# Patient Record
Sex: Male | Born: 1970 | Race: White | Hispanic: No | Marital: Married | Smoking: Never smoker
Health system: Southern US, Community
[De-identification: ages and names within clinical notes are randomized; demographics above are authoritative.]

## PROBLEM LIST (undated history)

## (undated) DIAGNOSIS — I10 Essential (primary) hypertension: Secondary | ICD-10-CM

## (undated) DIAGNOSIS — N200 Calculus of kidney: Secondary | ICD-10-CM

## (undated) DIAGNOSIS — Z87442 Personal history of urinary calculi: Secondary | ICD-10-CM

## (undated) DIAGNOSIS — F419 Anxiety disorder, unspecified: Secondary | ICD-10-CM

## (undated) HISTORY — PX: EYE SURGERY: SHX253

---
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
## 2005-02-16 ENCOUNTER — Emergency Department: Payer: Self-pay | Admitting: Emergency Medicine

## 2005-06-28 ENCOUNTER — Emergency Department: Payer: Self-pay | Admitting: Emergency Medicine

## 2006-08-13 ENCOUNTER — Emergency Department: Payer: Self-pay | Admitting: Emergency Medicine

## 2007-10-08 ENCOUNTER — Other Ambulatory Visit: Payer: Self-pay

## 2007-10-08 ENCOUNTER — Emergency Department: Payer: Self-pay | Admitting: Emergency Medicine

## 2008-07-30 ENCOUNTER — Emergency Department: Payer: Self-pay | Admitting: Emergency Medicine

## 2009-08-10 ENCOUNTER — Emergency Department: Payer: Self-pay | Admitting: Unknown Physician Specialty

## 2009-09-19 ENCOUNTER — Emergency Department: Payer: Self-pay | Admitting: Internal Medicine

## 2010-03-08 ENCOUNTER — Ambulatory Visit: Payer: Self-pay | Admitting: Internal Medicine

## 2010-06-11 ENCOUNTER — Emergency Department: Payer: Self-pay | Admitting: Emergency Medicine

## 2010-06-13 ENCOUNTER — Emergency Department: Payer: Self-pay | Admitting: Emergency Medicine

## 2010-06-19 ENCOUNTER — Ambulatory Visit: Payer: Self-pay | Admitting: Urology

## 2010-06-27 ENCOUNTER — Emergency Department: Payer: Self-pay | Admitting: Emergency Medicine

## 2010-07-03 ENCOUNTER — Ambulatory Visit: Payer: Self-pay | Admitting: Urology

## 2010-07-17 ENCOUNTER — Ambulatory Visit: Payer: Self-pay | Admitting: Urology

## 2010-07-18 ENCOUNTER — Ambulatory Visit: Payer: Self-pay | Admitting: Urology

## 2010-07-24 ENCOUNTER — Ambulatory Visit: Payer: Self-pay | Admitting: Urology

## 2010-07-25 ENCOUNTER — Emergency Department: Payer: Self-pay | Admitting: Emergency Medicine

## 2011-03-17 ENCOUNTER — Emergency Department: Payer: Self-pay | Admitting: Emergency Medicine

## 2012-02-13 IMAGING — CR DG ABDOMEN 1V
1 series · 1 of 1 positions shown · non-contrast
Comparison: none

REASON FOR EXAM: kidney stone
COMMENTS:

[view not recorded]
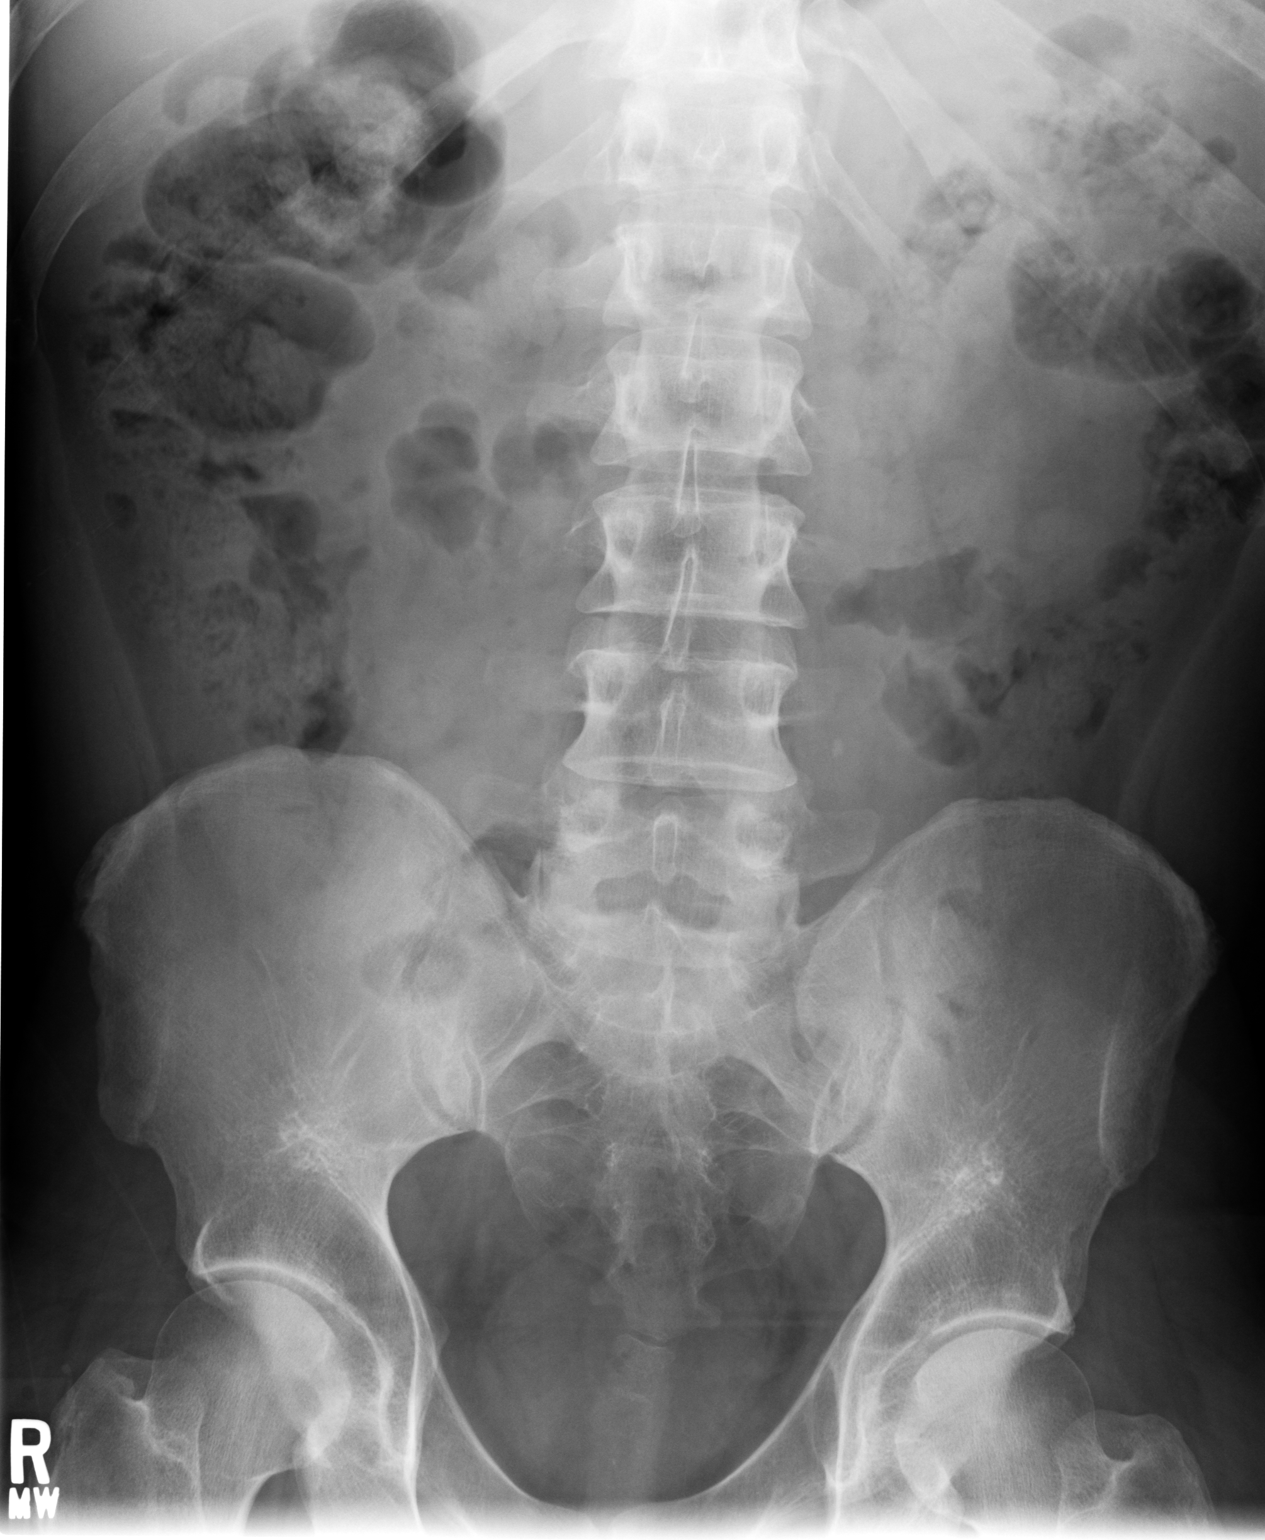

[1 of 1 positions shown; findings below may reference images not displayed]

PROCEDURE:     DXR - DXR KIDNEY URETER BLADDER  - June 13, 2010  [DATE]

RESULT:     AP view of the abdomen shows a 5 mm calcification inferior to
the L4 lumbar transverse process on the left and consistent with a left
ureteral stone. No other renal or ureteral calcifications are identified.
There is a moderate amount of fecal material in the colon. No dilated bowel
loops suspicious for bowel obstruction are seen. The osseous structures are
normal in appearance.
IMPRESSION: Probable left ureterolithiasis.

## 2012-04-16 ENCOUNTER — Emergency Department: Payer: Self-pay | Admitting: Emergency Medicine

## 2012-10-19 ENCOUNTER — Ambulatory Visit: Payer: Self-pay | Admitting: Family Medicine

## 2013-12-17 ENCOUNTER — Ambulatory Visit: Payer: Self-pay | Admitting: Emergency Medicine

## 2013-12-18 ENCOUNTER — Emergency Department: Payer: Self-pay | Admitting: Emergency Medicine

## 2013-12-18 LAB — BASIC METABOLIC PANEL
BUN: 12 mg/dL (ref 7–18)
Chloride: 103 mmol/L (ref 98–107)
Co2: 31 mmol/L (ref 21–32)
EGFR (African American): >60
EGFR (Non-African Amer.): >60
Glucose: 89 mg/dL (ref 65–99)
Osmolality: 277 (ref 275–301)
Potassium: 3.6 mmol/L (ref 3.5–5.1)
Sodium: 139 mmol/L (ref 136–145)

## 2013-12-18 LAB — CBC
HCT: 41.3 % (ref 40.0–52.0)
HGB: 14.3 g/dL (ref 13.0–18.0)
MCV: 86 fL (ref 80–100)
Platelet: 99 10*3/uL — ABNORMAL LOW (ref 150–440)
RBC: 4.81 10*6/uL (ref 4.40–5.90)
RDW: 13.8 % (ref 11.5–14.5)
WBC: 3.9 10*3/uL (ref 3.8–10.6)

## 2014-07-11 ENCOUNTER — Emergency Department: Payer: Self-pay | Admitting: Emergency Medicine

## 2014-07-11 LAB — CBC WITH DIFFERENTIAL/PLATELET
BASOS PCT: 0.7 %
Basophil #: 0 10*3/uL (ref 0.0–0.1)
EOS ABS: 0.1 10*3/uL (ref 0.0–0.7)
Eosinophil %: 2.1 %
HCT: 45.9 % (ref 40.0–52.0)
HGB: 15.7 g/dL (ref 13.0–18.0)
Lymphocyte #: 2.5 10*3/uL (ref 1.0–3.6)
Lymphocyte %: 39.7 %
MCH: 30 pg (ref 26.0–34.0)
MCHC: 34.3 g/dL (ref 32.0–36.0)
MCV: 88 fL (ref 80–100)
MONOS PCT: 12.8 %
Monocyte #: 0.8 x10 3/mm (ref 0.2–1.0)
NEUTROS ABS: 2.8 10*3/uL (ref 1.4–6.5)
Neutrophil %: 44.7 %
Platelet: 152 10*3/uL (ref 150–440)
RBC: 5.25 10*6/uL (ref 4.40–5.90)
RDW: 13.8 % (ref 11.5–14.5)
WBC: 6.3 10*3/uL (ref 3.8–10.6)

## 2014-07-11 LAB — COMPREHENSIVE METABOLIC PANEL
ALK PHOS: 94 U/L
ALT: 37 U/L
ANION GAP: 5 — AB (ref 7–16)
Albumin: 4 g/dL (ref 3.4–5.0)
BUN: 15 mg/dL (ref 7–18)
Bilirubin,Total: 0.4 mg/dL (ref 0.2–1.0)
CREATININE: 1.33 mg/dL — AB (ref 0.60–1.30)
Calcium, Total: 9.7 mg/dL (ref 8.5–10.1)
Chloride: 104 mmol/L (ref 98–107)
Co2: 29 mmol/L (ref 21–32)
EGFR (African American): >60
EGFR (Non-African Amer.): >60
Glucose: 93 mg/dL (ref 65–99)
Osmolality: 276 (ref 275–301)
Potassium: 3.9 mmol/L (ref 3.5–5.1)
SGOT(AST): 25 U/L (ref 15–37)
SODIUM: 138 mmol/L (ref 136–145)
Total Protein: 8.6 g/dL — ABNORMAL HIGH (ref 6.4–8.2)

## 2014-07-11 LAB — TROPONIN I

## 2014-08-14 ENCOUNTER — Emergency Department: Payer: Self-pay | Admitting: Emergency Medicine

## 2014-08-14 LAB — COMPREHENSIVE METABOLIC PANEL
ALBUMIN: 3.6 g/dL (ref 3.4–5.0)
ALT: 36 U/L
AST: 39 U/L — AB (ref 15–37)
Alkaline Phosphatase: 106 U/L
Anion Gap: 8 (ref 7–16)
BUN: 20 mg/dL — AB (ref 7–18)
Bilirubin,Total: 0.3 mg/dL (ref 0.2–1.0)
CREATININE: 1.11 mg/dL (ref 0.60–1.30)
Calcium, Total: 9.1 mg/dL (ref 8.5–10.1)
Chloride: 105 mmol/L (ref 98–107)
Co2: 28 mmol/L (ref 21–32)
EGFR (Non-African Amer.): >60
GLUCOSE: 104 mg/dL — AB (ref 65–99)
Osmolality: 284 (ref 275–301)
POTASSIUM: 3.8 mmol/L (ref 3.5–5.1)
SODIUM: 141 mmol/L (ref 136–145)
TOTAL PROTEIN: 7.5 g/dL (ref 6.4–8.2)

## 2014-08-14 LAB — CBC WITH DIFFERENTIAL/PLATELET
BASOS PCT: 0.5 %
Basophil #: 0 10*3/uL (ref 0.0–0.1)
EOS ABS: 0.2 10*3/uL (ref 0.0–0.7)
Eosinophil %: 2.9 %
HCT: 42.7 % (ref 40.0–52.0)
HGB: 14.8 g/dL (ref 13.0–18.0)
Lymphocyte #: 3 10*3/uL (ref 1.0–3.6)
Lymphocyte %: 42.8 %
MCH: 30.4 pg (ref 26.0–34.0)
MCHC: 34.6 g/dL (ref 32.0–36.0)
MCV: 88 fL (ref 80–100)
Monocyte #: 0.8 x10 3/mm (ref 0.2–1.0)
Monocyte %: 11.7 %
NEUTROS ABS: 3 10*3/uL (ref 1.4–6.5)
Neutrophil %: 42.1 %
Platelet: 133 10*3/uL — ABNORMAL LOW (ref 150–440)
RBC: 4.87 10*6/uL (ref 4.40–5.90)
RDW: 14.1 % (ref 11.5–14.5)
WBC: 7.1 10*3/uL (ref 3.8–10.6)

## 2015-08-13 ENCOUNTER — Ambulatory Visit
Admission: EM | Admit: 2015-08-13 | Discharge: 2015-08-13 | Disposition: A | Discharge status: Home / Self Care | Payer: BLUE CROSS/BLUE SHIELD | Attending: Family Medicine | Admitting: Family Medicine

## 2015-08-13 ENCOUNTER — Ambulatory Visit: Payer: BLUE CROSS/BLUE SHIELD

## 2015-08-13 DIAGNOSIS — S7002XA Contusion of left hip, initial encounter: Secondary | ICD-10-CM | POA: Diagnosis not present

## 2015-08-13 HISTORY — DX: Essential (primary) hypertension: I10

## 2015-08-13 HISTORY — DX: Calculus of kidney: N20.0

## 2015-08-13 LAB — URINALYSIS COMPLETE WITH MICROSCOPIC (ARMC ONLY)
BACTERIA UA: NONE SEEN — AB
Bilirubin Urine: NEGATIVE
Glucose, UA: NEGATIVE mg/dL
HGB URINE DIPSTICK: NEGATIVE
Ketones, ur: NEGATIVE mg/dL
LEUKOCYTES UA: NEGATIVE
NITRITE: NEGATIVE
PH: 5.5 (ref 5.0–8.0)
PROTEIN: NEGATIVE mg/dL
RBC / HPF: NONE SEEN RBC/hpf (ref <3)
Specific Gravity, Urine: 1.02 (ref 1.005–1.030)
Squamous Epithelial / LPF: NONE SEEN — AB

## 2015-08-13 MED ORDER — MELOXICAM 15 MG PO TABS: 15.0000 mg | ORAL_TABLET | Freq: Every day | ORAL | Status: DC

## 2015-08-13 NOTE — Discharge Instructions (Signed)
Contusion A contusion is a deep bruise. Contusions happen when an injury causes bleeding under the skin. Signs of bruising include pain, puffiness (swelling), and discolored skin. The contusion may turn blue, purple, or yellow. HOME CARE   Put ice on the injured area.  Put ice in a plastic bag.  Place a towel between your skin and the bag.  Leave the ice on for 15-20 minutes, 03-04 times a day.  Only take medicine as told by your doctor.  Rest the injured area.  If possible, raise (elevate) the injured area to lessen puffiness. GET HELP RIGHT AWAY IF:   You have more bruising or puffiness.  You have pain that is getting worse.  Your puffiness or pain is not helped by medicine. MAKE SURE YOU:   Understand these instructions.  Will watch your condition.  Will get help right away if you are not doing well or get worse. Document Released: 05/25/2008 Document Revised: 02/29/2012 Document Reviewed: 10/12/2011 [facility] Patient Information 2015 [place], [place]. This information is not intended to replace advice given to you by your health care provider. Make sure you discuss any questions you have with your health care provider.  Iliac Crest Contusion  An iliac crest contusion is a deep bruise of your hip bone (hip pointer). Contusions happen when an injury causes bleeding under the skin. Signs of bruising include pain, puffiness (swelling), and discolored skin. The contusion may turn blue, purple, or yellow. HOME CARE   Put ice on the injured area.  Put ice in a plastic bag.  Place a towel between your skin and the bag.  Leave the ice on for 15-20 minutes, 03-04 times a day.  Only take medicines as told by your doctor.  Keep your leg straight (extended) when possible.  Walk and move around as pain allows, or as told by your doctor. Use crutches if you are told to do so.  Put on an elastic wrap as told by your doctor. You can take it off for sleeping, showers, and  baths. GET HELP RIGHT AWAY IF:  You have more bruising or puffiness.  You have pain that is getting worse.  Your puffiness or pain is not helped by medicines.  Your toes get cold. MAKE SURE YOU:   Understand these instructions.  Will watch your condition.  Will get help right away if you are not doing well or get worse. Document Released: 11/26/2011 Document Revised: 06/07/2012 Document Reviewed: 11/26/2011 [facility] Patient Information 2015 [place], [place]. This information is not intended to replace advice given to you by your health care provider. Make sure you discuss any questions you have with your health care provider.

## 2015-08-13 NOTE — ED Provider Notes (Addendum)
CSN: [id number]     Arrival date & time 08/13/15  1415 History   First MD Initiated Contact with Patient 08/13/15 1450     Chief Complaint  Patient presents with  . Fall   (Consider location/radiation/quality/duration/timing/severity/associated sxs/prior Treatment) Patient is a 44 y.o. male presenting with fall and hip pain. The history is provided by the patient and the spouse. No language interpreter was used.  Fall This is a new problem. The current episode started yesterday. The problem occurs constantly. The problem has not changed since onset.Pertinent negatives include no chest pain, no abdominal pain, no headaches and no shortness of breath. The symptoms are aggravated by walking. Nothing relieves the symptoms. He has tried nothing for the symptoms.  Hip Pain This is a new problem. The current episode started yesterday. The problem occurs constantly. The problem has not changed since onset.Pertinent negatives include no chest pain, no abdominal pain, no headaches and no shortness of breath. The symptoms are aggravated by walking. Nothing relieves the symptoms. He has tried nothing for the symptoms.    Past Medical History  Diagnosis Date  . Hypertension   . Kidney stones    History reviewed. No pertinent past surgical history. Family History  Problem Relation Age of Onset  . Hypertension Mother   . Diabetes Father   . Hypertension Father   . Seizures Brother    Social History  Substance Use Topics  . Smoking status: Never Smoker   . Smokeless tobacco: Current User    Types: Chew  . Alcohol Use: No    patient does not smoke but does use chews tobacco which according to his wife is trying to quit. History of hypertension as well    Review of Systems  Constitutional: Positive for activity change. Negative for fever and appetite change.  Respiratory: Negative for shortness of breath.   Cardiovascular: Negative for chest pain.  Gastrointestinal: Negative for abdominal  pain.  Musculoskeletal: Positive for myalgias, joint swelling and gait problem.  Neurological: Negative for dizziness, light-headedness and headaches.    Allergies  Review of patient's allergies indicates no known allergies.  Home Medications   Prior to Admission medications   Medication Sig Start Date End Date Taking? Authorizing Provider  hydrochlorothiazide (HYDRODIURIL) 12.5 MG tablet Take 12.5 mg by mouth daily.   Yes Historical Provider, MD  meloxicam (MOBIC) 15 MG tablet Take 1 tablet (15 mg total) by mouth daily. 08/13/15   Hassan Rowan, MD   BP 141/75 mmHg  Pulse 56  Temp(Src) 97.8 F (36.6 C) (Tympanic)  Resp 16  Ht 5\' 8"  (1.727 m)  Wt 185 lb (83.915 kg)  BMI 28.14 kg/m2  SpO2 100% Physical Exam  Constitutional: He is oriented to person, place, and time. He appears well-developed and well-nourished.  HENT:  Head: Normocephalic and atraumatic.  Eyes: Pupils are equal, round, and reactive to light.  Neck: Neck supple.  Cardiovascular: Regular rhythm.   Musculoskeletal: Normal range of motion. He exhibits tenderness.       Left hip: He exhibits tenderness.       Legs: Large hematomas over the left hip. Tender to palpation.  Neurological: He is alert and oriented to person, place, and time. No cranial nerve deficit.  Skin: Skin is warm.  Psychiatric: He has a normal mood and affect. His behavior is normal.  Vitals reviewed.   ED Course  Procedures (including critical care time) Labs Review Labs Reviewed  URINALYSIS COMPLETEWITH MICROSCOPIC [facility] ONLY) - Abnormal; Notable for the  following:    Bacteria, UA NONE SEEN (*)    Squamous Epithelial / LPF NONE SEEN (*)    All other components within normal limits    Imaging Review Dg Hip Unilat With Pelvis 2-3 Views Left  08/13/2015   CLINICAL DATA:  Patient complaining of left hip pain from jabbing left hip into gear shift on lawn mower yesterday. There is a visible bruise where is pain is. No previous injury  EXAM:  DG HIP (WITH OR WITHOUT PELVIS) 2-3V LEFT  COMPARISON:  None.  FINDINGS: There is no evidence of hip fracture or dislocation. There is no evidence of arthropathy or other focal bone abnormality.  IMPRESSION: Negative.   Electronically Signed   By: Amie Portland M.D.   On: 08/13/2015 15:41     MDM   1. Contusion of left hip     Urinalysis was negative for blood. Hip x-rays were also negative. Work note will be given for today and tomorrow. Patient's wife been instructed to use ice on the left hip. We'll place on Mobic 15 mg one not to use Motrin.   Hassan Rowan, MD 08/13/15 1601  Hassan Rowan, MD 08/13/15 2156

## 2015-08-13 NOTE — ED Notes (Signed)
States fell yesterday landing on the gear shift knob of riding lawn mower. Large painful bruise left lateral/flank area.

## 2017-02-20 ENCOUNTER — Emergency Department
Admission: EM | Admit: 2017-02-20 | Discharge: 2017-02-20 | Disposition: A | Discharge status: Home / Self Care | Payer: BLUE CROSS/BLUE SHIELD | Attending: Emergency Medicine | Admitting: Emergency Medicine

## 2017-02-20 ENCOUNTER — Encounter: Payer: Self-pay | Admitting: Emergency Medicine

## 2017-02-20 ENCOUNTER — Emergency Department: Payer: BLUE CROSS/BLUE SHIELD

## 2017-02-20 DIAGNOSIS — R202 Paresthesia of skin: Secondary | ICD-10-CM | POA: Insufficient documentation

## 2017-02-20 DIAGNOSIS — Z79899 Other long term (current) drug therapy: Secondary | ICD-10-CM | POA: Diagnosis not present

## 2017-02-20 DIAGNOSIS — F419 Anxiety disorder, unspecified: Secondary | ICD-10-CM | POA: Insufficient documentation

## 2017-02-20 DIAGNOSIS — I10 Essential (primary) hypertension: Secondary | ICD-10-CM | POA: Insufficient documentation

## 2017-02-20 DIAGNOSIS — F17220 Nicotine dependence, chewing tobacco, uncomplicated: Secondary | ICD-10-CM | POA: Insufficient documentation

## 2017-02-20 LAB — CBC WITH DIFFERENTIAL/PLATELET
BASOS ABS: 0 10*3/uL (ref 0–0.1)
BASOS PCT: 1 %
EOS PCT: 2 %
Eosinophils Absolute: 0.1 10*3/uL (ref 0–0.7)
HCT: 44.7 % (ref 40.0–52.0)
Hemoglobin: 15.5 g/dL (ref 13.0–18.0)
LYMPHS PCT: 37 %
Lymphs Abs: 1.6 10*3/uL (ref 1.0–3.6)
MCH: 30.2 pg (ref 26.0–34.0)
MCHC: 34.8 g/dL (ref 32.0–36.0)
MCV: 86.8 fL (ref 80.0–100.0)
Monocytes Absolute: 0.6 10*3/uL (ref 0.2–1.0)
Monocytes Relative: 13 %
Neutro Abs: 2.1 10*3/uL (ref 1.4–6.5)
Neutrophils Relative %: 47 %
PLATELETS: 145 10*3/uL — AB (ref 150–440)
RBC: 5.15 MIL/uL (ref 4.40–5.90)
RDW: 14.1 % (ref 11.5–14.5)
WBC: 4.5 10*3/uL (ref 3.8–10.6)

## 2017-02-20 LAB — BASIC METABOLIC PANEL
ANION GAP: 7 (ref 5–15)
BUN: 16 mg/dL (ref 6–20)
CALCIUM: 9.7 mg/dL (ref 8.9–10.3)
CO2: 25 mmol/L (ref 22–32)
Chloride: 105 mmol/L (ref 101–111)
Creatinine, Ser: 1.14 mg/dL (ref 0.61–1.24)
GFR calc Af Amer: >60 mL/min (ref >60)
Glucose, Bld: 102 mg/dL — ABNORMAL HIGH (ref 65–99)
POTASSIUM: 5.2 mmol/L — AB (ref 3.5–5.1)
SODIUM: 137 mmol/L (ref 135–145)

## 2017-02-20 LAB — TROPONIN I

## 2017-02-20 MED ORDER — LABETALOL HCL 5 MG/ML IV SOLN: 20.0000 mg | Freq: Once | INTRAVENOUS | Status: DC

## 2017-02-20 MED ORDER — ASPIRIN 81 MG PO CHEW: 324.0000 mg | CHEWABLE_TABLET | Freq: Once | ORAL | Status: DC

## 2017-02-20 NOTE — ED Notes (Signed)
NAD noted at time of D/C. Pt denies questions or concerns. Pt ambulatory to the lobby at this time.  

## 2017-02-20 NOTE — ED Triage Notes (Signed)
Pt ambulatory to tx rm from triage, report elevated BP, SBP in 170s, report mild headache and tingling in cheeks.  Pt NAD at this time.

## 2017-02-20 NOTE — ED Notes (Signed)
This RN to bedside, apologized for and explained delay to patient and wife, pt and wife state understanding. Denies any needs at this time. Will continue to monitor for further patient needs.

## 2017-02-20 NOTE — ED Notes (Signed)
This RN to bedside. Pt visualized in NAD. Respirations even and unlabored. Pt watching TV, delay explained to patient. Will continue to monitor for further patient needs.

## 2017-02-20 NOTE — ED Provider Notes (Addendum)
[facility] Emergency Department Provider Note  ____________________________________________   I have reviewed the triage vital signs and the nursing notes.   HISTORY  Chief Complaint Hypertension    HPI Dennis Francis is a 46 y.o. male the history of hypertension presents today complaining of bilateral cheek tingling, and a sensation that he feels somewhat weird. He has a history of poorly controlled hypertension. He has been on HCTZ for many years. He did take it last night. Symptoms began when he awoke this morning. He felt fine when he went but yesterday. He denies any nausea vomiting or headache. He denies a focal numbness or weakness aside from feeling both of his cheeks feeling "weird", he denies any difficulty speaking or talking. He states that he has been under great deal stress recently. He denies any SI or HI.     Past Medical History:  Diagnosis Date  . Hypertension   . Kidney stones     There are no active problems to display for this patient.   History reviewed. No pertinent surgical history.  Prior to Admission medications   Medication Sig Start Date End Date Taking? Authorizing Provider  hydrochlorothiazide (HYDRODIURIL) 12.5 MG tablet Take 12.5 mg by mouth daily.   Yes Historical Provider, MD  meloxicam (MOBIC) 15 MG tablet Take 1 tablet (15 mg total) by mouth daily. 08/13/15   Hassan RowanEugene Wade, MD    Allergies Patient has no known allergies.  Family History  Problem Relation Age of Onset  . Hypertension Mother   . Diabetes Father   . Hypertension Father   . Seizures Brother     Social History Social History  Substance Use Topics  . Smoking status: Never Smoker  . Smokeless tobacco: Current User    Types: Chew  . Alcohol use No    Review of Systems Constitutional: No fever/chills Eyes: No visual changes. ENT: No sore throat. No stiff neck no neck pain Cardiovascular: Denies chest pain. Respiratory: Denies shortness of  breath. Gastrointestinal:   no vomiting.  No diarrhea.  No constipation. Genitourinary: Negative for dysuria. Musculoskeletal: Negative lower extremity swelling Skin: Negative for rash. Neurological: See history of present illness 10-point ROS otherwise negative.  ____________________________________________   PHYSICAL EXAM:  VITAL SIGNS: ED Triage Vitals  Enc Vitals Group     BP 02/20/17 0653 (!) 182/101     Pulse Rate 02/20/17 0653 64     Resp 02/20/17 0653 20     Temp 02/20/17 0653 98 F (36.7 C)     Temp Source 02/20/17 0653 Oral     SpO2 02/20/17 0653 100 %     Weight 02/20/17 0654 190 lb (86.2 kg)     Height 02/20/17 0654 5\' 8"  (1.727 m)     Head Circumference --      Peak Flow --      Pain Score 02/20/17 0654 2     Pain Loc --      Pain Edu? --      Excl. in GC? --     Constitutional: Alert and oriented. Well appearing and in no acute distress. Eyes: Conjunctivae are normal. PERRL. EOMI. Head: Atraumatic. Nose: No congestion/rhinnorhea. Mouth/Throat: Mucous membranes are moist.  Oropharynx non-erythematous. Neck: No stridor.   Nontender with no meningismus Cardiovascular: Normal rate, regular rhythm. Grossly normal heart sounds.  Good peripheral circulation. Respiratory: Normal respiratory effort.  No retractions. Lungs CTAB. Abdominal: Soft and nontender. No distention. No guarding no rebound Back:  There is no focal  tenderness or step off.  there is no midline tenderness there are no lesions noted. there is no CVA tenderness Musculoskeletal: No lower extremity tenderness, no upper extremity tenderness. No joint effusions, no DVT signs strong distal pulses no edema Neurologic:  Cranial nerves II through XII are grossly intact 5 out of 5 strength bilateral upper and lower extremity. Finger to nose within normal limits heel to shin within normal limits, speech is normal with no word finding difficulty or dysarthria, reflexes symmetric, pupils are equally round and  reactive to light, there is no pronator drift, sensation is normal, vision is intact to confrontation, gait is deferred, there is no nystagmus, normal neurologic exam Skin:  Skin is warm, dry and intact. No rash noted. Psychiatric: Mood and affect are anxious. Speech and behavior are normal.  ____________________________________________   LABS (all labs ordered are listed, but only abnormal results are displayed)  Labs Reviewed  BASIC METABOLIC PANEL  CBC WITH DIFFERENTIAL/PLATELET  TROPONIN I   ____________________________________________  EKG  I personally interpreted any EKGs ordered by me or triage Sinus bradycardia rate 54 bpm no acute ischemic changes, normal axis, no ST elevation or depression side from bradycardia, no significant pathology noted ____________________________________________  RADIOLOGY  I reviewed any imaging ordered by me or triage that were performed during my shift and, if possible, patient and/or family made aware of any abnormal findings. ____________________________________________   PROCEDURES  Procedure(s) performed: None  Procedures  Critical Care performed: None  ____________________________________________   INITIAL IMPRESSION / ASSESSMENT AND PLAN / ED COURSE  Pertinent labs & imaging results that were available during my care of the patient were reviewed by me and considered in my medical decision making (see chart for details).  Patient with a nonfocal neurologic exam, appears quite anxious. States both his cheeks word "feeling weird", there is no evidence of significant unilateral neurologic issue at this time however given the pressure and vague neurologic complaints they will obtain a CT scan to see if the patient has had prior CVAs or further abnormalities are noted. Patient's blood pressure is trending down on its own. We will check basic blood work to see if he has had renal issues from hypertension, we will check a troponin as a  precaution given his complaints of facial symptoms but I don't think this represent referred ACS discomfort. Overall, patient's very comfortable and looks quite well and his pressure is noted is now in the 160s   ----------------------------------------- 9:39 AM on 02/20/2017 -----------------------------------------  Patient remains in no acute distress neurologic exam completely normal vital signs are reassuring at this time. As he relaxes, his blood pressures come down a great deal as expected and anticipated. Workup is normal. Patient does state that he feels that he was just stressed out this morning. He has no SI or HI. He will follow closely with his primary care doctor and I have recommended counseling. Return precautions and follow-up given and understood. Nothing to suggest CVA at this time or other indices of acute hypertensive crisis     ____________________________________________   FINAL CLINICAL IMPRESSION(S) / ED DIAGNOSES  Final diagnoses:  None      This chart was dictated using voice recognition software.  Despite best efforts to proofread,  errors can occur which can change meaning.       Jeanmarie Plant, MD 02/20/17 1610    Jeanmarie Plant, MD 02/20/17 9604    Jeanmarie Plant, MD 02/20/17 5409    Jeanmarie Plant,  MD 02/20/17 [phone]

## 2017-02-20 NOTE — ED Notes (Signed)
Pt returned from CT °

## 2017-10-05 ENCOUNTER — Encounter: Payer: Self-pay | Admitting: *Deleted

## 2017-10-05 ENCOUNTER — Ambulatory Visit
Admission: EM | Admit: 2017-10-05 | Discharge: 2017-10-05 | Disposition: A | Discharge status: Home / Self Care | Payer: BLUE CROSS/BLUE SHIELD | Attending: Family Medicine | Admitting: Family Medicine

## 2017-10-05 DIAGNOSIS — M545 Low back pain: Secondary | ICD-10-CM | POA: Diagnosis not present

## 2017-10-05 DIAGNOSIS — S39012A Strain of muscle, fascia and tendon of lower back, initial encounter: Secondary | ICD-10-CM | POA: Diagnosis not present

## 2017-10-05 MED ORDER — METAXALONE 800 MG PO TABS: 800.0000 mg | ORAL_TABLET | Freq: Three times a day (TID) | ORAL | 0 refills | Status: DC

## 2017-10-05 MED ORDER — MELOXICAM 15 MG PO TABS: 15.0000 mg | ORAL_TABLET | Freq: Every day | ORAL | 0 refills | Status: DC

## 2017-10-05 NOTE — Discharge Instructions (Signed)
Use ice 20 minute of every 2 hours times daily.
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
## 2020-04-25 ENCOUNTER — Other Ambulatory Visit: Payer: Self-pay

## 2020-04-25 ENCOUNTER — Ambulatory Visit
Admission: EM | Admit: 2020-04-25 | Discharge: 2020-04-25 | Disposition: A | Discharge status: Home / Self Care | Payer: BC Managed Care – PPO | Attending: Family Medicine | Admitting: Family Medicine

## 2020-04-25 ENCOUNTER — Encounter: Payer: Self-pay | Admitting: Emergency Medicine

## 2020-04-25 DIAGNOSIS — T50Z95A Adverse effect of other vaccines and biological substances, initial encounter: Secondary | ICD-10-CM | POA: Diagnosis not present

## 2020-04-25 MED ORDER — PREDNISONE 10 MG (21) PO TBPK: ORAL_TABLET | ORAL | 0 refills | Status: DC

## 2020-04-25 NOTE — ED Provider Notes (Signed)
[facility]    CSN: [id number] Arrival date & time: 04/25/20  0949      History   Chief Complaint Chief Complaint  Patient presents with  . Rash   HPI  49 year old male presents with rash.  Patient reports that he got Pfizer Covid vaccination on Tuesday.  Patient reports that last night his wife noted redness and mild swelling at the injection site (left arm).  Patient reports mild pain.  Rates his pain as 5/10 in severity.  Patient is concerned given recent vaccination and potential side effects.  No fever.  No other associated symptoms.  No other complaints.  Past Medical History:  Diagnosis Date  . Hypertension   . Kidney stones     Home Medications    Prior to Admission medications   Medication Sig Start Date End Date Taking? Authorizing Provider  atorvastatin (LIPITOR) 40 MG tablet Take by mouth. 03/20/20 03/20/21 Yes [provider]  citalopram (CELEXA) 20 MG tablet Take 20 mg by mouth daily.   Yes [provider]  hydrochlorothiazide (HYDRODIURIL) 12.5 MG tablet Take 12.5 mg by mouth daily.   Yes [provider]  losartan (COZAAR) 50 MG tablet Take by mouth. 03/20/20 03/20/21 Yes [provider]  niacin (NIASPAN) 500 MG CR tablet Take 500 mg by mouth at bedtime.   Yes [provider]  meloxicam (MOBIC) 15 MG tablet Take 1 tablet (15 mg total) by mouth daily. 10/05/17   Lutricia Feil, PA-C  metaxalone (SKELAXIN) 800 MG tablet Take 1 tablet (800 mg total) by mouth 3 (three) times daily. 10/05/17   Lutricia Feil, PA-C  predniSONE (STERAPRED UNI-PAK 21 TAB) 10 MG (21) TBPK tablet 6 tablets on day 1; decrease by 1 tablet daily until gone. 04/25/20   Tommie Sams, DO    Family History Family History  Problem Relation Age of Onset  . Hypertension Mother   . Diabetes Father   . Hypertension Father   . Seizures Brother     Social History Social History   Tobacco Use  . Smoking status: Never Smoker  .  Smokeless tobacco: Current User    Types: Chew  Substance Use Topics  . Alcohol use: No  . Drug use: No     Allergies   Patient has no known allergies.   Review of Systems Review of Systems  Constitutional: Negative for fever.  Musculoskeletal:       Left arm pain, redness.   Physical Exam Triage Vital Signs ED Triage Vitals  Enc Vitals Group     BP 04/25/20 1028 (!) 152/105     Pulse Rate 04/25/20 1028 (!) 53     Resp 04/25/20 1028 18     Temp 04/25/20 1028 97.8 F (36.6 C)     Temp Source 04/25/20 1028 Oral     SpO2 04/25/20 1028 99 %     Weight 04/25/20 1026 207 lb (93.9 kg)     Height 04/25/20 1026 5\' 8"  (1.727 m)     Head Circumference --      Peak Flow --      Pain Score 04/25/20 1026 5     Pain Loc --      Pain Edu? --      Excl. in GC? --    Updated Vital Signs BP (!) 152/105 (BP Location: Right Arm)   Pulse (!) 53   Temp 97.8 F (36.6 C) (Oral)   Resp 18   Ht  5\' 8"  (1.727 m)   Wt 93.9 kg   SpO2 99%   BMI 31.47 kg/m   Visual Acuity Right Eye Distance:   Left Eye Distance:   Bilateral Distance:    Right Eye Near:   Left Eye Near:    Bilateral Near:     Physical Exam Vitals and nursing note reviewed.  Constitutional:      General: He is not in acute distress.    Appearance: Normal appearance. He is not ill-appearing.  HENT:     Head: Normocephalic and atraumatic.  Eyes:     General:        Right eye: No discharge.        Left eye: No discharge.     Conjunctiva/sclera: Conjunctivae normal.  Cardiovascular:     Rate and Rhythm: Regular rhythm. Bradycardia present.  Pulmonary:     Effort: Pulmonary effort is normal.     Breath sounds: Normal breath sounds. No wheezing, rhonchi or rales.  Skin:    Comments: Left upper arm with an area of swelling and erythema.  Neurological:     Mental Status: He is alert.  Psychiatric:        Mood and Affect: Mood normal.        Behavior: Behavior normal.    UC Treatments / Results  Labs (all  labs ordered are listed, but only abnormal results are displayed) Labs Reviewed - No data to display  EKG   Radiology No results found.  Procedures Procedures (including critical care time)  Medications Ordered in UC Medications - No data to display  Initial Impression / Assessment and Plan / UC Course  I have reviewed the triage vital signs and the nursing notes.  Pertinent labs & imaging results that were available during my care of the patient were reviewed by me and considered in my medical decision making (see chart for details).    14 male presents with a localized, adverse reaction to recent Covid vaccination.  Does not appear to be infected.  Brief course of prednisone to help with inflammation and swelling/redness.  Final Clinical Impressions(s) / UC Diagnoses   Final diagnoses:  Adverse effect of vaccine, initial encounter     Discharge Instructions     Rest.  Ice.  Medication as prescribed.  Take care  Dr. Lacinda Axon    ED Prescriptions    Medication Sig Dispense Auth. Provider   predniSONE (STERAPRED UNI-PAK 21 TAB) 10 MG (21) TBPK tablet 6 tablets on day 1; decrease by 1 tablet daily until gone. 21 tablet Thersa Salt G, DO     PDMP not reviewed this encounter.   Coral Spikes, DO 04/25/20 1500

## 2020-04-25 NOTE — ED Triage Notes (Signed)
Patient c/o localized rash, redness and swelling to left arm after Pfizer vaccination on Tuesday.

## 2020-04-25 NOTE — Discharge Instructions (Signed)
Rest.  Ice.   Medication as prescribed.  Take care  Dr. Susa Bones  

## 2020-12-28 ENCOUNTER — Emergency Department: Payer: BC Managed Care – PPO

## 2020-12-28 ENCOUNTER — Observation Stay
Admission: EM | Admit: 2020-12-28 | Discharge: 2020-12-29 | Disposition: A | Discharge status: Home / Self Care | Payer: BC Managed Care – PPO | Attending: Internal Medicine | Admitting: Internal Medicine

## 2020-12-28 ENCOUNTER — Other Ambulatory Visit: Payer: Self-pay

## 2020-12-28 ENCOUNTER — Encounter: Payer: Self-pay | Admitting: Emergency Medicine

## 2020-12-28 ENCOUNTER — Inpatient Hospital Stay: Payer: BC Managed Care – PPO

## 2020-12-28 DIAGNOSIS — S93326A Dislocation of tarsometatarsal joint of unspecified foot, initial encounter: Secondary | ICD-10-CM | POA: Diagnosis not present

## 2020-12-28 DIAGNOSIS — F17220 Nicotine dependence, chewing tobacco, uncomplicated: Secondary | ICD-10-CM | POA: Insufficient documentation

## 2020-12-28 DIAGNOSIS — I10 Essential (primary) hypertension: Secondary | ICD-10-CM | POA: Diagnosis not present

## 2020-12-28 DIAGNOSIS — W11XXXA Fall on and from ladder, initial encounter: Secondary | ICD-10-CM | POA: Insufficient documentation

## 2020-12-28 DIAGNOSIS — F32A Depression, unspecified: Secondary | ICD-10-CM | POA: Insufficient documentation

## 2020-12-28 DIAGNOSIS — Z79899 Other long term (current) drug therapy: Secondary | ICD-10-CM | POA: Insufficient documentation

## 2020-12-28 DIAGNOSIS — Z6832 Body mass index (BMI) 32.0-32.9, adult: Secondary | ICD-10-CM | POA: Insufficient documentation

## 2020-12-28 DIAGNOSIS — E785 Hyperlipidemia, unspecified: Secondary | ICD-10-CM | POA: Insufficient documentation

## 2020-12-28 DIAGNOSIS — E669 Obesity, unspecified: Secondary | ICD-10-CM | POA: Insufficient documentation

## 2020-12-28 DIAGNOSIS — Z20822 Contact with and (suspected) exposure to covid-19: Secondary | ICD-10-CM | POA: Insufficient documentation

## 2020-12-28 DIAGNOSIS — S93325A Dislocation of tarsometatarsal joint of left foot, initial encounter: Secondary | ICD-10-CM | POA: Diagnosis not present

## 2020-12-28 DIAGNOSIS — S92902A Unspecified fracture of left foot, initial encounter for closed fracture: Secondary | ICD-10-CM

## 2020-12-28 MED ORDER — METAXALONE 800 MG PO TABS: 800.0000 mg | ORAL_TABLET | Freq: Three times a day (TID) | ORAL | Status: DC

## 2020-12-28 MED ORDER — LOSARTAN POTASSIUM 50 MG PO TABS
50.0000 mg | ORAL_TABLET | Freq: Every day | ORAL | Status: DC
  Filled 2020-12-28: qty 1

## 2020-12-28 MED ORDER — MORPHINE SULFATE (PF) 2 MG/ML IV SOLN
0.5000 mg | INTRAVENOUS | Status: DC | PRN
  Administered 2020-12-29: 0.5 mg via INTRAVENOUS
  Filled 2020-12-28: qty 1

## 2020-12-28 MED ORDER — HYDROCODONE-ACETAMINOPHEN 5-325 MG PO TABS
1.0000 | ORAL_TABLET | Freq: Four times a day (QID) | ORAL | Status: DC | PRN
  Administered 2020-12-29: 2 via ORAL
  Filled 2020-12-28: qty 2

## 2020-12-28 MED ORDER — LACTATED RINGERS IV BOLUS
1000.0000 mL | Freq: Once | INTRAVENOUS | Status: AC
  Administered 2020-12-29: 1000 mL via INTRAVENOUS

## 2020-12-28 MED ORDER — HYDROMORPHONE HCL 1 MG/ML IJ SOLN
1.0000 mg | Freq: Once | INTRAMUSCULAR | Status: AC
  Administered 2020-12-28: 1 mg via INTRAVENOUS
  Filled 2020-12-28: qty 1

## 2020-12-28 MED ORDER — ATORVASTATIN CALCIUM 20 MG PO TABS: 40.0000 mg | ORAL_TABLET | Freq: Every day | ORAL | Status: DC

## 2020-12-28 MED ORDER — HYDROCHLOROTHIAZIDE 25 MG PO TABS
12.5000 mg | ORAL_TABLET | Freq: Every day | ORAL | Status: DC
  Filled 2020-12-28: qty 1

## 2020-12-28 MED ORDER — CITALOPRAM HYDROBROMIDE 20 MG PO TABS: 20.0000 mg | ORAL_TABLET | Freq: Every day | ORAL | Status: DC

## 2020-12-28 MED ORDER — KETOROLAC TROMETHAMINE 30 MG/ML IJ SOLN
15.0000 mg | Freq: Once | INTRAMUSCULAR | Status: AC
  Administered 2020-12-28: 15 mg via INTRAVENOUS
  Filled 2020-12-28: qty 1

## 2020-12-28 MED ORDER — NIACIN ER (ANTIHYPERLIPIDEMIC) 500 MG PO TBCR
500.0000 mg | EXTENDED_RELEASE_TABLET | Freq: Every day | ORAL | Status: DC
  Filled 2020-12-28 (×2): qty 1

## 2020-12-28 MED ORDER — ENOXAPARIN SODIUM 40 MG/0.4ML ~~LOC~~ SOLN: 40.0000 mg | SUBCUTANEOUS | Status: DC

## 2020-12-28 MED ORDER — BISACODYL 5 MG PO TBEC
5.0000 mg | DELAYED_RELEASE_TABLET | Freq: Every day | ORAL | Status: DC | PRN
  Filled 2020-12-28: qty 1

## 2020-12-28 NOTE — H&P (Signed)
Chief Complaint: Patient presents with left foot pain status post fall from a height. HPI:  Dennis Francis is an 50 y.o. male with history of hypertension, hyperlipidemia who presented to the emergency room on account of left foot pain.  Patient had a fall from a 10 foot height when he missed a step on a ladder.  Patient landed on his left foot and has been unable to walk on foot since due to severe pain.  He denied any syncope prior to the event, attributed it strictly to missing a step.  Patient denied hitting his head.  In the ER, imaging studies done did reveal evidence of Lisfranc fracture.  Patient was seen in the ED by podiatrist with plan for surgical intervention scheduled for tomorrow morning.  Patient was referred to hospitalist service for admission and preop management.  Past Medical History:  Diagnosis Date  . Hypertension   . Kidney stones     History reviewed. No pertinent surgical history.  Family History  Problem Relation Age of Onset  . Hypertension Mother   . Diabetes Father   . Hypertension Father   . Seizures Brother    Social History:  reports that he has never smoked. His smokeless tobacco use includes chew. He reports current alcohol use. He reports that he does not use drugs.  Allergies: No Known Allergies  (Not in a hospital admission)   No results found for this or any previous visit (from the past 48 hour(s)). CT Foot Left Wo Contrast  Result Date: 12/28/2020 CLINICAL DATA:  Foot trauma, Lisfranc suspected, xray done (Age >= 6y) Fall from ladder. EXAM: CT OF THE LEFT FOOT WITHOUT CONTRAST TECHNIQUE: Multidetector CT imaging of the left foot was performed according to the standard protocol. Multiplanar CT image reconstructions were also generated. COMPARISON:  Radiograph earlier today. FINDINGS: Bones/Joint/Cartilage Homolateral Lisfranc fracture dislocation. There is also dorsal dislocation of the second metatarsal at the tarsal metatarsal joint, with the  proximal metatarsal perched on the cuneiform. Comminuted fractures at the base of the second, third, fourth metatarsals with multiple small fracture fragments at the articular surface. Multiple comminuted midfoot fractures. There are comminuted fractures of the medial, mid, and lateral cuneiform at the tarsal metatarsal articular surface with innumerable small fracture fragments. Mildly displaced lateral cuboid fracture. Nondisplaced medial navicular fracture. Mildly displaced distal third metatarsal fracture involving the metatarsal neck. The digits appear intact. Ligaments Suboptimally assessed by CT. Suspected disruption of the Lisfranc ligament. Muscles and Tendons No tendon entrapment at the ankle. Hemorrhage within the intrinsic musculature of the foot. Soft tissues Soft tissue edema is prominent overlying the dorsum of the foot. IMPRESSION: 1. Homolateral Lisfranc fracture dislocation. There is also dorsal dislocation of the second metatarsal at the tarsometatarsal joint, with the proximal metatarsal perched on the mid and lateral cuneiform. 2. Comminuted fractures at the base of the second, third, fourth metatarsals with multiple small fracture fragments at the articular surface. 3. Comminuted midfoot fractures with innumerable small fracture fragments at the tarsal metatarsal articular surface, fractures involve the medial, mid and lateral cuneiform. Lateral cuboid fracture. 4. Nondisplaced medial navicular fracture. 5. Mildly displaced distal third metatarsal shaft fracture involving the metatarsal neck. Electronically Signed   By: Narda Rutherford M.D.   On: 12/28/2020 19:53   DG Foot Complete Left  Result Date: 12/28/2020 CLINICAL DATA:  Jumped off of a ladder EXAM: LEFT FOOT - COMPLETE 3+ VIEW COMPARISON:  None. FINDINGS: There is a Lisfranc injury with lateral displacement of the first  through fifth TMTs. The second metatarsal is laterally displaced along the intermediate cuneiform by approximately  11 mm. There is posterior displacement of the metatarsals in relation to the tarsal bones. There is a comminuted fracture of the lateral cuboid. There is a comminuted fracture of the lateral cuneiform with lateral displacement of the third TMT by at least 12 mm. Likely comminuted fracture of the intermediate cuneiform. Lucency along the medial aspect of the navicular bone may reflect an additional nondisplaced fracture. Soft tissue edema. No unexpected radiopaque foreign body. IMPRESSION: 1. Lisfranc injury with lateral and posterior dislocation of the first through fifth TMTs. 2. Comminuted fracture of the lateral cuboid, lateral and likely intermediate cuneiform. 3. Possible nondisplaced fracture of the medial navicular bone. Electronically Signed   By: Meda Klinefelter MD   On: 12/28/2020 18:14    Review of Systems  Constitutional: Negative.   HENT: Negative.   Eyes: Negative.   Respiratory: Negative.   Cardiovascular: Negative.   Gastrointestinal: Negative.   Endocrine: Negative.   Genitourinary: Negative.   Musculoskeletal: Positive for joint swelling.  Neurological: Negative.   Hematological: Negative.   Psychiatric/Behavioral: Negative.     Blood pressure (!) 145/91, pulse (!) 56, temperature 97.6 F (36.4 C), temperature source Oral, resp. rate 18, height 5\' 8"  (1.727 m), weight 98 kg, SpO2 98 %. Physical Exam Constitutional:      Appearance: Normal appearance. He is obese.  HENT:     Head: Normocephalic and atraumatic.  Cardiovascular:     Rate and Rhythm: Normal rate and regular rhythm.  Pulmonary:     Effort: Pulmonary effort is normal.     Breath sounds: Normal breath sounds.  Abdominal:     General: Bowel sounds are normal.     Palpations: Abdomen is soft.  Musculoskeletal:     Cervical back: Normal range of motion and neck supple.     Comments: LLE with splint and dressing in place.   Neurological:     General: No focal deficit present.     Mental Status: He is  alert and oriented to person, place, and time.  Psychiatric:        Mood and Affect: Mood normal.        Behavior: Behavior normal.        Thought Content: Thought content normal.        Judgment: Judgment normal.      Assessment/Plan Acute left lower extremity Lisfranc fracture: Secondary to fall from a height of 10 feet.  Patient was seen and evaluated by podiatrist Dr. with plans for surgery in a.m.  Patient will be optimized for pain with analgesics.  Elevate foot of bed.  Keep n.p.o. after midnight.  Apply ice to site.  CBC and Chem-7 pending.  PT/INR for a.m.  Preop cardiac assessment: Patient has no prior history of coronary disease.  Denied any recent chest pain.  Has greater than 4 METS score for exercise without any adverse sequelae.  Based off on patient's history, patient has low risk for procedure.  Chronic essential hypertension: Blood pressure stable.  Will resume his home medications.  Bradycardia: EKG pending.No indication for telemetry at this time.  Hyperlipidemia.  Patient will be continued on statins.  Ether Griffins, MD 12/28/2020, 9:13 PM

## 2020-12-28 NOTE — Consult Note (Signed)
ORTHOPAEDIC CONSULTATION  REQUESTING PHYSICIAN: Sharman Cheek, MD  Chief Complaint: Left foot injury from fall  HPI: Dennis Francis is a 50 y.o. male who complains of left foot injury from fall.  He states he was about 10 feet on a ladder.  Fell earlier today.  Twisted and injured his left foot.  Did not pass out.  Not complaining of back pain.  Brought to the ER via EMS.  Given pain medication.  X-ray showed a complete dislocation of his Lisfranc region.  CT scan has been performed.  Past Medical History:  Diagnosis Date  . Hypertension   . Kidney stones    History reviewed. No pertinent surgical history. Social History   Socioeconomic History  . Marital status: Married    Spouse name: Not on file  . Number of children: Not on file  . Years of education: Not on file  . Highest education level: Not on file  Occupational History  . Not on file  Tobacco Use  . Smoking status: Never Smoker  . Smokeless tobacco: Current User    Types: Chew  Vaping Use  . Vaping Use: Never used  Substance and Sexual Activity  . Alcohol use: Yes    Comment: rarely  . Drug use: No  . Sexual activity: Not on file  Other Topics Concern  . Not on file  Social History Narrative  . Not on file   Social Determinants of Health   Financial Resource Strain: Not on file  Food Insecurity: Not on file  Transportation Needs: Not on file  Physical Activity: Not on file  Stress: Not on file  Social Connections: Not on file   Family History  Problem Relation Age of Onset  . Hypertension Mother   . Diabetes Father   . Hypertension Father   . Seizures Brother    No Known Allergies Prior to Admission medications   Medication Sig Start Date End Date Taking? Authorizing Provider  atorvastatin (LIPITOR) 40 MG tablet Take by mouth. 03/20/20 03/20/21  [provider]  citalopram (CELEXA) 20 MG tablet Take 20 mg by mouth daily.    [provider]  hydrochlorothiazide (HYDRODIURIL)  12.5 MG tablet Take 12.5 mg by mouth daily.    [provider]  losartan (COZAAR) 50 MG tablet Take by mouth. 03/20/20 03/20/21  [provider]  meloxicam (MOBIC) 15 MG tablet Take 1 tablet (15 mg total) by mouth daily. 10/05/17   Lutricia Feil, PA-C  metaxalone (SKELAXIN) 800 MG tablet Take 1 tablet (800 mg total) by mouth 3 (three) times daily. 10/05/17   Lutricia Feil, PA-C  niacin (NIASPAN) 500 MG CR tablet Take 500 mg by mouth at bedtime.    [provider]  predniSONE (STERAPRED UNI-PAK 21 TAB) 10 MG (21) TBPK tablet 6 tablets on day 1; decrease by 1 tablet daily until gone. 04/25/20   Tommie Sams, DO   CT Foot Left Wo Contrast  Result Date: 12/28/2020 CLINICAL DATA:  Foot trauma, Lisfranc suspected, xray done (Age >= 6y) Fall from ladder. EXAM: CT OF THE LEFT FOOT WITHOUT CONTRAST TECHNIQUE: Multidetector CT imaging of the left foot was performed according to the standard protocol. Multiplanar CT image reconstructions were also generated. COMPARISON:  Radiograph earlier today. FINDINGS: Bones/Joint/Cartilage Homolateral Lisfranc fracture dislocation. There is also dorsal dislocation of the second metatarsal at the tarsal metatarsal joint, with the proximal metatarsal perched on the cuneiform. Comminuted fractures at the base of the second, third, fourth metatarsals with  multiple small fracture fragments at the articular surface. Multiple comminuted midfoot fractures. There are comminuted fractures of the medial, mid, and lateral cuneiform at the tarsal metatarsal articular surface with innumerable small fracture fragments. Mildly displaced lateral cuboid fracture. Nondisplaced medial navicular fracture. Mildly displaced distal third metatarsal fracture involving the metatarsal neck. The digits appear intact. Ligaments Suboptimally assessed by CT. Suspected disruption of the Lisfranc ligament. Muscles and Tendons No tendon entrapment at the ankle. Hemorrhage within the  intrinsic musculature of the foot. Soft tissues Soft tissue edema is prominent overlying the dorsum of the foot. IMPRESSION: 1. Homolateral Lisfranc fracture dislocation. There is also dorsal dislocation of the second metatarsal at the tarsometatarsal joint, with the proximal metatarsal perched on the mid and lateral cuneiform. 2. Comminuted fractures at the base of the second, third, fourth metatarsals with multiple small fracture fragments at the articular surface. 3. Comminuted midfoot fractures with innumerable small fracture fragments at the tarsal metatarsal articular surface, fractures involve the medial, mid and lateral cuneiform. Lateral cuboid fracture. 4. Nondisplaced medial navicular fracture. 5. Mildly displaced distal third metatarsal shaft fracture involving the metatarsal neck. Electronically Signed   By: Narda Rutherford M.D.   On: 12/28/2020 19:53   DG Foot Complete Left  Result Date: 12/28/2020 CLINICAL DATA:  Jumped off of a ladder EXAM: LEFT FOOT - COMPLETE 3+ VIEW COMPARISON:  None. FINDINGS: There is a Lisfranc injury with lateral displacement of the first through fifth TMTs. The second metatarsal is laterally displaced along the intermediate cuneiform by approximately 11 mm. There is posterior displacement of the metatarsals in relation to the tarsal bones. There is a comminuted fracture of the lateral cuboid. There is a comminuted fracture of the lateral cuneiform with lateral displacement of the third TMT by at least 12 mm. Likely comminuted fracture of the intermediate cuneiform. Lucency along the medial aspect of the navicular bone may reflect an additional nondisplaced fracture. Soft tissue edema. No unexpected radiopaque foreign body. IMPRESSION: 1. Lisfranc injury with lateral and posterior dislocation of the first through fifth TMTs. 2. Comminuted fracture of the lateral cuboid, lateral and likely intermediate cuneiform. 3. Possible nondisplaced fracture of the medial navicular  bone. Electronically Signed   By: Meda Klinefelter MD   On: 12/28/2020 18:14    Positive ROS: All other systems have been reviewed and were otherwise negative with the exception of those mentioned in the HPI and as above.  12 point ROS was performed.  Physical Exam: General: Alert and oriented.  No apparent distress.  Vascular:  Left foot:Dorsalis Pedis:  present Posterior Tibial:  present  Right foot: Not evaluated  Neuro:intact sensation to all digits.  No numbness.  Derm: Skin is intact.  No tenting of the skin.  No areas of immediate pressure to the skin.  No open skin breaks.  Noted ecchymosis to the foot  Ortho/MS: Diffuse edema to the midfoot.  I personally evaluated the CT scan as well as the x-rays that shows a complete dislocation dorsal and lateral of the left metatarsals.  The second and third metatarsals are dramatically dorsally dislocated and sitting dorsal to the cuneiforms.  Other fractures to the foot are noted including distal third metatarsal and lateral cuboid.  Assessment: Lisfranc fracture dislocation with multiple midfoot fractures.  Plan: At this point foot is stable but he has a fairly severe dislocation especially of the midtarsal regions.  At this point I would recommend open reduction with internal fixation to further realign the midtarsal joint and prevent  tenting of the skin and soft tissue damage in this area.  Patient understands that the plan will be for surgical reduction with stabilization with possible fusion of the metatarsal cuneiform regions if needed.  We will make n.p.o. after midnight.  Plan for surgery tomorrow.  A posterior splint was placed by myself today with gentle compression to prevent further edema to the foot.    Irean Hong, DPM Cell [phone]   12/28/2020 8:12 PM

## 2020-12-28 NOTE — ED Triage Notes (Signed)
Brought in by EMS pt was on ladder doing yard work on ladder about 8 feet high. The ladder began to fall pt jumped off hurt left ankle. Pt with left ankle swelling and pain 8/10. Pt given 75 mcg of fentanyl by Ems prior to arrival.

## 2020-12-28 NOTE — ED Provider Notes (Signed)
[facility] Emergency Department Provider Note  ____________________________________________  Time seen: Approximately 5:48 PM  I have reviewed the triage vital signs and the nursing notes.   HISTORY  Chief Complaint Leg Injury    HPI Dennis Francis is a 50 y.o. male with a past history of hypertension who comes ED complaining of left foot pain that started suddenly after falling off a ladder about 8 feet in the air.  Pain is constant severe nonradiating, worse with movement, associated with swelling in the foot.  Denies any other injuries or pain complaints.   Patient received 75 mcg IV fentanyl by EMS prior to arrival   Past Medical History:  Diagnosis Date  . Hypertension   . Kidney stones      There are no problems to display for this patient.    History reviewed. No pertinent surgical history.   Prior to Admission medications   Medication Sig Start Date End Date Taking? Authorizing Provider  atorvastatin (LIPITOR) 40 MG tablet Take by mouth. 03/20/20 03/20/21  [provider]  citalopram (CELEXA) 20 MG tablet Take 20 mg by mouth daily.    [provider]  hydrochlorothiazide (HYDRODIURIL) 12.5 MG tablet Take 12.5 mg by mouth daily.    [provider]  losartan (COZAAR) 50 MG tablet Take by mouth. 03/20/20 03/20/21  [provider]  meloxicam (MOBIC) 15 MG tablet Take 1 tablet (15 mg total) by mouth daily. 10/05/17   Lutricia Feil, PA-C  metaxalone (SKELAXIN) 800 MG tablet Take 1 tablet (800 mg total) by mouth 3 (three) times daily. 10/05/17   Lutricia Feil, PA-C  niacin (NIASPAN) 500 MG CR tablet Take 500 mg by mouth at bedtime.    [provider]  predniSONE (STERAPRED UNI-PAK 21 TAB) 10 MG (21) TBPK tablet 6 tablets on day 1; decrease by 1 tablet daily until gone. 04/25/20   Tommie Sams, DO     Allergies Patient has no known allergies.   Family History  Problem Relation Age of Onset  .  Hypertension Mother   . Diabetes Father   . Hypertension Father   . Seizures Brother     Social History Social History   Tobacco Use  . Smoking status: Never Smoker  . Smokeless tobacco: Current User    Types: Chew  Vaping Use  . Vaping Use: Never used  Substance Use Topics  . Alcohol use: Yes    Comment: rarely  . Drug use: No    Review of Systems  Constitutional:   No fever or chills.  ENT:   No sore throat. No rhinorrhea. Cardiovascular:   No chest pain or syncope. Respiratory:   No dyspnea or cough. Gastrointestinal:   Negative for abdominal pain, vomiting and diarrhea.  Musculoskeletal: Left foot pain and swelling All other systems reviewed and are negative except as documented above in ROS and HPI.  ____________________________________________   PHYSICAL EXAM:  VITAL SIGNS: ED Triage Vitals  Enc Vitals Group     BP 12/28/20 1735 (!) 145/91     Pulse Rate 12/28/20 1735 (!) 56     Resp 12/28/20 1735 18     Temp 12/28/20 1735 97.6 F (36.4 C)     Temp Source 12/28/20 1735 Oral     SpO2 12/28/20 1735 98 %     Weight 12/28/20 1738 216 lb (98 kg)     Height 12/28/20 1738 5\' 8"  (1.727 m)     Head Circumference --  Peak Flow --      Pain Score 12/28/20 1738 8     Pain Loc --      Pain Edu? --      Excl. in GC? --     Vital signs reviewed, nursing assessments reviewed.   Constitutional:   Alert and oriented. Non-toxic appearance. Eyes:   Conjunctivae are normal. EOMI. PERRL. ENT      Head:   Normocephalic and atraumatic.      Nose:   Wearing a mask.      Mouth/Throat:   Wearing a mask.      Neck:   No meningismus. Full ROM. Hematological/Lymphatic/Immunilogical:   No cervical lymphadenopathy. Cardiovascular:   RRR. Symmetric bilateral radial and DP pulses.  No murmurs. Cap refill less than 2 seconds. Respiratory:   Normal respiratory effort without tachypnea/retractions. Breath sounds are clear and equal bilaterally. No  wheezes/rales/rhonchi. Gastrointestinal:   Soft and nontender. Non distended. There is no CVA tenderness.  No rebound, rigidity, or guarding.  Musculoskeletal: Left foot with diffuse swelling and tenderness.  Ankle malleoli nontender, no other deformity or swelling or bony tenderness.  Pelvis and ribs are stable and nontender.  Clavicles and neck are stable and nontender. Neurologic:   Normal speech and language.  Motor grossly intact. No acute focal neurologic deficits are appreciated.  Skin:    Skin is warm, dry and intact. No rash noted.  No petechiae, purpura, or bullae.  No lacerations  ____________________________________________    LABS (pertinent positives/negatives) (all labs ordered are listed, but only abnormal results are displayed) Labs Reviewed  SARS CORONAVIRUS 2 (TAT 6-24 HRS)  BASIC METABOLIC PANEL  CBC WITH DIFFERENTIAL/PLATELET  PROTIME-INR  APTT  TYPE AND SCREEN   ____________________________________________   EKG    ____________________________________________    RADIOLOGY  CT Foot Left Wo Contrast  Result Date: 12/28/2020 CLINICAL DATA:  Foot trauma, Lisfranc suspected, xray done (Age >= 6y) Fall from ladder. EXAM: CT OF THE LEFT FOOT WITHOUT CONTRAST TECHNIQUE: Multidetector CT imaging of the left foot was performed according to the standard protocol. Multiplanar CT image reconstructions were also generated. COMPARISON:  Radiograph earlier today. FINDINGS: Bones/Joint/Cartilage Homolateral Lisfranc fracture dislocation. There is also dorsal dislocation of the second metatarsal at the tarsal metatarsal joint, with the proximal metatarsal perched on the cuneiform. Comminuted fractures at the base of the second, third, fourth metatarsals with multiple small fracture fragments at the articular surface. Multiple comminuted midfoot fractures. There are comminuted fractures of the medial, mid, and lateral cuneiform at the tarsal metatarsal articular surface with  innumerable small fracture fragments. Mildly displaced lateral cuboid fracture. Nondisplaced medial navicular fracture. Mildly displaced distal third metatarsal fracture involving the metatarsal neck. The digits appear intact. Ligaments Suboptimally assessed by CT. Suspected disruption of the Lisfranc ligament. Muscles and Tendons No tendon entrapment at the ankle. Hemorrhage within the intrinsic musculature of the foot. Soft tissues Soft tissue edema is prominent overlying the dorsum of the foot. IMPRESSION: 1. Homolateral Lisfranc fracture dislocation. There is also dorsal dislocation of the second metatarsal at the tarsometatarsal joint, with the proximal metatarsal perched on the mid and lateral cuneiform. 2. Comminuted fractures at the base of the second, third, fourth metatarsals with multiple small fracture fragments at the articular surface. 3. Comminuted midfoot fractures with innumerable small fracture fragments at the tarsal metatarsal articular surface, fractures involve the medial, mid and lateral cuneiform. Lateral cuboid fracture. 4. Nondisplaced medial navicular fracture. 5. Mildly displaced distal third metatarsal shaft fracture involving the  metatarsal neck. Electronically Signed   By: Narda Rutherford M.D.   On: 12/28/2020 19:53   DG Foot Complete Left  Result Date: 12/28/2020 CLINICAL DATA:  Jumped off of a ladder EXAM: LEFT FOOT - COMPLETE 3+ VIEW COMPARISON:  None. FINDINGS: There is a Lisfranc injury with lateral displacement of the first through fifth TMTs. The second metatarsal is laterally displaced along the intermediate cuneiform by approximately 11 mm. There is posterior displacement of the metatarsals in relation to the tarsal bones. There is a comminuted fracture of the lateral cuboid. There is a comminuted fracture of the lateral cuneiform with lateral displacement of the third TMT by at least 12 mm. Likely comminuted fracture of the intermediate cuneiform. Lucency along the medial  aspect of the navicular bone may reflect an additional nondisplaced fracture. Soft tissue edema. No unexpected radiopaque foreign body. IMPRESSION: 1. Lisfranc injury with lateral and posterior dislocation of the first through fifth TMTs. 2. Comminuted fracture of the lateral cuboid, lateral and likely intermediate cuneiform. 3. Possible nondisplaced fracture of the medial navicular bone. Electronically Signed   By: Meda Klinefelter MD   On: 12/28/2020 18:14    ____________________________________________   PROCEDURES Procedures  ____________________________________________    CLINICAL IMPRESSION / ASSESSMENT AND PLAN / ED COURSE  Medications ordered in the ED: Medications  lactated ringers bolus 1,000 mL (has no administration in time range)  HYDROmorphone (DILAUDID) injection 1 mg (1 mg Intravenous Given 12/28/20 1740)    Pertinent labs & imaging results that were available during my care of the patient were reviewed by me and considered in my medical decision making (see chart for details).  Dennis Francis was evaluated in Emergency Department on 12/28/2020 for the symptoms described in the history of present illness. He was evaluated in the context of the global COVID-19 pandemic, which necessitated consideration that the patient might be at risk for infection with the SARS-CoV-2 virus that causes COVID-19. Institutional protocols and algorithms that pertain to the evaluation of patients at risk for COVID-19 are in a state of rapid change based on information released by regulatory bodies including the CDC and federal and state organizations. These policies and algorithms were followed during the patient's care in the ED.   Patient presents with diffuse swelling and pain of the left foot after falling onto the foot from about 8 feet in the air off a ladder.  No other apparent injuries.  We will x-ray the foot, give Dilaudid 1 mg IV for pain control for  now.   ----------------------------------------- 8:17 PM on 12/28/2020 -----------------------------------------  X-ray showed multiple injuries.  Images reviewed and interpreted by me, showing multiple TMT dislocations and other fractures.  Discussed with podiatry Dr. Ether Griffins who request CT scan of the foot.  He has come and placed a splint on the LLE. CT report reviewed, confirms multiple injuries including Lisfranc dislocation.  Case discussed with hospitalist for preop management.  Lower leg elevated, ice applied.      ____________________________________________   FINAL CLINICAL IMPRESSION(S) / ED DIAGNOSES    Final diagnoses:  Lisfranc's dislocation, left, initial encounter  Fracture of left foot, closed, initial encounter     ED Discharge Orders    None      Portions of this note were generated with dragon dictation software. Dictation errors may occur despite best attempts at proofreading.   Sharman Cheek, MD 12/28/20 2018

## 2020-12-29 ENCOUNTER — Encounter
Admission: EM | Disposition: A | Discharge status: Home / Self Care | Payer: Self-pay | Source: Home / Self Care | Attending: Emergency Medicine

## 2020-12-29 DIAGNOSIS — S92902A Unspecified fracture of left foot, initial encounter for closed fracture: Secondary | ICD-10-CM | POA: Diagnosis present

## 2020-12-29 DIAGNOSIS — S93325A Dislocation of tarsometatarsal joint of left foot, initial encounter: Secondary | ICD-10-CM | POA: Diagnosis not present

## 2020-12-29 LAB — CBC WITH DIFFERENTIAL/PLATELET
Abs Immature Granulocytes: 0.03 10*3/uL (ref 0.00–0.07)
Basophils Absolute: 0 10*3/uL (ref 0.0–0.1)
Basophils Relative: 0 %
Eosinophils Absolute: 0.1 10*3/uL (ref 0.0–0.5)
Eosinophils Relative: 1 %
HCT: 38.2 % — ABNORMAL LOW (ref 39.0–52.0)
Hemoglobin: 13.7 g/dL (ref 13.0–17.0)
Immature Granulocytes: 0 %
Lymphocytes Relative: 18 %
Lymphs Abs: 1.6 10*3/uL (ref 0.7–4.0)
MCH: 30.9 pg (ref 26.0–34.0)
MCHC: 35.9 g/dL (ref 30.0–36.0)
MCV: 86 fL (ref 80.0–100.0)
Monocytes Absolute: 1.1 10*3/uL — ABNORMAL HIGH (ref 0.1–1.0)
Monocytes Relative: 12 %
Neutro Abs: 6.1 10*3/uL (ref 1.7–7.7)
Neutrophils Relative %: 69 %
Platelets: 150 10*3/uL (ref 150–400)
RBC: 4.44 MIL/uL (ref 4.22–5.81)
RDW: 13 % (ref 11.5–15.5)
WBC: 8.9 10*3/uL (ref 4.0–10.5)
nRBC: 0 % (ref 0.0–0.2)

## 2020-12-29 LAB — BASIC METABOLIC PANEL
Anion gap: 8 (ref 5–15)
BUN: 18 mg/dL (ref 6–20)
CO2: 30 mmol/L (ref 22–32)
Calcium: 9.8 mg/dL (ref 8.9–10.3)
Chloride: 102 mmol/L (ref 98–111)
Creatinine, Ser: 1.26 mg/dL — ABNORMAL HIGH (ref 0.61–1.24)
GFR, Estimated: >60 mL/min (ref >60)
Glucose, Bld: 129 mg/dL — ABNORMAL HIGH (ref 70–99)
Potassium: 4 mmol/L (ref 3.5–5.1)
Sodium: 140 mmol/L (ref 135–145)

## 2020-12-29 LAB — TYPE AND SCREEN
ABO/RH(D): O NEG
Antibody Screen: NEGATIVE

## 2020-12-29 LAB — PROTIME-INR
INR: 1.1 (ref 0.8–1.2)
Prothrombin Time: 14.2 seconds (ref 11.4–15.2)

## 2020-12-29 LAB — RESP PANEL BY RT-PCR (FLU A&B, COVID) ARPGX2
Influenza A by PCR: NEGATIVE
Influenza B by PCR: NEGATIVE
SARS Coronavirus 2 by RT PCR: NEGATIVE

## 2020-12-29 LAB — HIV ANTIBODY (ROUTINE TESTING W REFLEX): HIV Screen 4th Generation wRfx: NONREACTIVE

## 2020-12-29 LAB — APTT: aPTT: 28 seconds (ref 24–36)

## 2020-12-29 SURGERY — OPEN REDUCTION INTERNAL FIXATION (ORIF) ANKLE FRACTURE
Anesthesia: Choice | Site: Ankle | Laterality: Left

## 2020-12-29 MED ORDER — CEFAZOLIN SODIUM-DEXTROSE 2-4 GM/100ML-% IV SOLN
2.0000 g | INTRAVENOUS | Status: AC
  Administered 2020-12-29: 2 g via INTRAVENOUS
  Filled 2020-12-29: qty 100

## 2020-12-29 MED ORDER — ROCURONIUM BROMIDE 10 MG/ML (PF) SYRINGE
PREFILLED_SYRINGE | INTRAVENOUS | Status: AC
  Filled 2020-12-29: qty 10

## 2020-12-29 MED ORDER — OXYCODONE-ACETAMINOPHEN 5-325 MG PO TABS: 1.0000 | ORAL_TABLET | ORAL | Status: DC | PRN

## 2020-12-29 MED ORDER — MIDAZOLAM HCL 2 MG/2ML IJ SOLN
INTRAMUSCULAR | Status: AC
  Filled 2020-12-29: qty 2

## 2020-12-29 MED ORDER — PROPOFOL 10 MG/ML IV BOLUS
INTRAVENOUS | Status: AC
  Filled 2020-12-29: qty 20

## 2020-12-29 MED ORDER — FENTANYL CITRATE (PF) 100 MCG/2ML IJ SOLN
INTRAMUSCULAR | Status: AC
  Filled 2020-12-29: qty 2

## 2020-12-29 MED ORDER — LIDOCAINE HCL (PF) 2 % IJ SOLN
INTRAMUSCULAR | Status: AC
  Filled 2020-12-29: qty 5

## 2020-12-29 MED ORDER — OXYCODONE-ACETAMINOPHEN 5-325 MG PO TABS: 1.0000 | ORAL_TABLET | Freq: Four times a day (QID) | ORAL | 0 refills | Status: AC | PRN

## 2020-12-29 MED ORDER — OXYCODONE-ACETAMINOPHEN 5-325 MG PO TABS
ORAL_TABLET | ORAL | Status: AC
  Administered 2020-12-29: 1
  Filled 2020-12-29: qty 1

## 2020-12-29 SURGICAL SUPPLY — 45 items
BLADE SURG 15 STRL LF DISP TIS (BLADE) IMPLANT
BLADE SURG 15 STRL SS (BLADE)
BNDG COHESIVE 4X5 TAN STRL (GAUZE/BANDAGES/DRESSINGS) ×2 IMPLANT
BNDG CONFORM 2 STRL LF (GAUZE/BANDAGES/DRESSINGS) ×2 IMPLANT
BNDG CONFORM 3 STRL LF (GAUZE/BANDAGES/DRESSINGS) ×2 IMPLANT
BNDG ELASTIC 4X5.8 VLCR NS LF (GAUZE/BANDAGES/DRESSINGS) ×2 IMPLANT
BNDG ESMARK 4X12 TAN STRL LF (GAUZE/BANDAGES/DRESSINGS) ×2 IMPLANT
BNDG GAUZE 4.5X4.1 6PLY STRL (MISCELLANEOUS) IMPLANT
COVER WAND RF STERILE (DRAPES) ×2 IMPLANT
CUFF TOURN SGL QUICK 18X4 (TOURNIQUET CUFF) IMPLANT
CUFF TOURN SGL QUICK 24 (TOURNIQUET CUFF)
CUFF TRNQT CYL 24X4X16.5-23 (TOURNIQUET CUFF) IMPLANT
DRAPE C-ARM XRAY 36X54 (DRAPES) IMPLANT
DRAPE C-ARMOR (DRAPES) IMPLANT
DURAPREP 26ML APPLICATOR (WOUND CARE) ×2 IMPLANT
ELECT REM PT RETURN 9FT ADLT (ELECTROSURGICAL) ×2
ELECTRODE REM PT RTRN 9FT ADLT (ELECTROSURGICAL) ×1 IMPLANT
GAUZE SPONGE 4X4 12PLY STRL (GAUZE/BANDAGES/DRESSINGS) ×2 IMPLANT
GAUZE XEROFORM 1X8 LF (GAUZE/BANDAGES/DRESSINGS) IMPLANT
GLOVE BIO SURGEON STRL SZ7.5 (GLOVE) ×2 IMPLANT
GLOVE INDICATOR 8.0 STRL GRN (GLOVE) ×2 IMPLANT
GOWN STRL REUS W/ TWL XL LVL3 (GOWN DISPOSABLE) ×3 IMPLANT
GOWN STRL REUS W/TWL XL LVL3 (GOWN DISPOSABLE) ×3
KIT TURNOVER KIT A (KITS) ×2 IMPLANT
LABEL OR SOLS (LABEL) ×2 IMPLANT
MANIFOLD NEPTUNE II (INSTRUMENTS) ×2 IMPLANT
NEEDLE HYPO 22GX1.5 SAFETY (NEEDLE) ×2 IMPLANT
NS IRRIG 500ML POUR BTL (IV SOLUTION) ×2 IMPLANT
PACK EXTREMITY ARMC (MISCELLANEOUS) ×2 IMPLANT
PAD PREP 24X41 OB/GYN DISP (PERSONAL CARE ITEMS) ×2 IMPLANT
SPLINT CAST 1 STEP 4X30 (MISCELLANEOUS) IMPLANT
SPLINT FAST PLASTER 5X30 (CAST SUPPLIES)
SPLINT PLASTER CAST FAST 5X30 (CAST SUPPLIES) IMPLANT
SPONGE LAP 18X18 RF (DISPOSABLE) ×2 IMPLANT
STAPLER SKIN PROX 35W (STAPLE) ×2 IMPLANT
STOCKINETTE M/LG 89821 (MISCELLANEOUS) ×2 IMPLANT
STRAP SAFETY 5IN WIDE (MISCELLANEOUS) ×2 IMPLANT
STRIP CLOSURE SKIN 1/2X4 (GAUZE/BANDAGES/DRESSINGS) IMPLANT
SUT VIC AB 2-0 CT1 27 (SUTURE) ×1
SUT VIC AB 2-0 CT1 TAPERPNT 27 (SUTURE) ×1 IMPLANT
SUT VIC AB 3-0 SH 27 (SUTURE) ×1
SUT VIC AB 3-0 SH 27X BRD (SUTURE) ×1 IMPLANT
SWABSTK COMLB BENZOIN TINCTURE (MISCELLANEOUS) IMPLANT
SYR 10ML LL (SYRINGE) ×2 IMPLANT
SYR 50ML LL SCALE MARK (SYRINGE) ×2 IMPLANT

## 2020-12-29 NOTE — Discharge Instructions (Signed)
NWB to left foot. Elevate foot at all times.

## 2020-12-29 NOTE — Progress Notes (Signed)
PROGRESS NOTE    Dennis Francis  OHY:[id number] DOB: [birth year] DOA: 12/28/2020 PCP: Dennis Housekeeper, MD   Brief Narrative:  Dennis Francis is an 50 y.o. male with history of hypertension, tobacco use, hyperlipidemia who presented to the emergency room on account of left foot pain.  Patient had a fall from a 10 foot height when he missed a step on a ladder.  Patient landed on his left foot and has been unable to walk on foot since due to severe pain.   In the ER, imaging studies done did reveal evidence of Lisfranc fracture.  Patient was seen in the ED by podiatrist with plan for surgical intervention scheduled on 1/9.  Assessment & Plan:    Acute left lower extremity Lisfranc fracture: -Secondary to mechanical fall. -Reviewed x-ray and CT of left foot. -Evaluated by podiatrist Dr. Harvie Bridge is scheduled for surgery this afternoon. -Continue as needed pain medication -Patient is n.p.o. for the procedure. -Continue to monitor vital signs closely  Essential hypertension: Blood pressure stable -Continue losartan.  Hyperlipidemia: Continue statin  Depression: Continue Celexa  Tobacco abuse: Patient chewed tobacco -Denies alcohol, illicit khakis. Counseled about cessation  Obesity with BMI of 32: -Diet modification/exercise and weight loss recommended.  DVT prophylaxis: Lovenox Code Status: Full code Family Communication:  None present at bedside.  Plan of care discussed with patient in length and he verbalized understanding and agreed with it. Disposition Plan: Likely home in 1 to 2 days  Consultants:   Podiatrist  Procedures:   CT left foot  Antimicrobials:   Cefazolin  Status is: Inpatient  Dispo: The patient is from: Home              Anticipated d/c is to: Home              Anticipated d/c date is: 2 days              Patient currently is not medically stable to d/c.         Subjective: Patient seen and examined in the ED hallway. Resting  comfortably on the bed. Complaining of 3-4 out of 10 pain in left foot. Denies any other symptoms including headache, blurry vision, dizziness, lightheadedness, shortness of breath, chest pain, abdominal pain, nausea or vomiting.  Objective: Vitals:   12/28/20 1735 12/28/20 1738 12/29/20 0604  BP: (!) 145/91  140/69  Pulse: (!) 56  62  Resp: 18  18  Temp: 97.6 F (36.4 C)    TempSrc: Oral    SpO2: 98%  99%  Weight:  98 kg   Height:  5\' 8"  (1.727 m)     Intake/Output Summary (Last 24 hours) at 12/29/2020 0926 Last data filed at 12/29/2020 0302 Gross per 24 hour  Intake 1000 ml  Output 175 ml  Net 825 ml   Filed Weights   12/28/20 1738  Weight: 98 kg    Examination:  General exam: Appears calm and comfortable, on room air, communicating well, obese Respiratory system: Clear to auscultation. Respiratory effort normal. Cardiovascular system: S1 & S2 heard, RRR. No JVD, murmurs, rubs, gallops or clicks. No pedal edema. Gastrointestinal system: Abdomen is nondistended, soft and nontender. No organomegaly or masses felt. Normal bowel sounds heard. Central nervous system: Alert and oriented. No focal neurological deficits. Extremities: Left Foot: In a splint. Skin: No rashes, lesions or ulcers Psychiatry: Judgement and insight appear normal. Mood & affect appropriate.    Data Reviewed: I have personally reviewed following labs and imaging  studies  CBC: Recent Labs  Lab 12/29/20 0019  WBC 8.9  NEUTROABS 6.1  HGB 13.7  HCT 38.2*  MCV 86.0  PLT 150   Basic Metabolic Panel: Recent Labs  Lab 12/29/20 0019  NA 140  K 4.0  CL 102  CO2 30  GLUCOSE 129*  BUN 18  CREATININE 1.26*  CALCIUM 9.8   GFR: Estimated Creatinine Clearance: 81.3 mL/min (A) (by C-G formula based on SCr of 1.26 mg/dL (H)). Liver Function Tests: No results for input(s): AST, ALT, ALKPHOS, BILITOT, PROT, ALBUMIN in the last 168 hours. No results for input(s): LIPASE, AMYLASE in the last 168  hours. No results for input(s): AMMONIA in the last 168 hours. Coagulation Profile: Recent Labs  Lab 12/29/20 0019  INR 1.1   Cardiac Enzymes: No results for input(s): CKTOTAL, CKMB, CKMBINDEX, TROPONINI in the last 168 hours. BNP (last 3 results) No results for input(s): PROBNP in the last 8760 hours. HbA1C: No results for input(s): HGBA1C in the last 72 hours. CBG: No results for input(s): GLUCAP in the last 168 hours. Lipid Profile: No results for input(s): CHOL, HDL, LDLCALC, TRIG, CHOLHDL, LDLDIRECT in the last 72 hours. Thyroid Function Tests: No results for input(s): TSH, T4TOTAL, FREET4, T3FREE, THYROIDAB in the last 72 hours. Anemia Panel: No results for input(s): VITAMINB12, FOLATE, FERRITIN, TIBC, IRON, RETICCTPCT in the last 72 hours. Sepsis Labs: No results for input(s): PROCALCITON, LATICACIDVEN in the last 168 hours.  Recent Results (from the past 240 hour(s))  Resp Panel by RT-PCR (Flu A&B, Covid) Nasopharyngeal Swab     Status: None   Collection Time: 12/29/20  3:38 AM   Specimen: Nasopharyngeal Swab; Nasopharyngeal(NP) swabs in vial transport medium  Result Value Ref Range Status   SARS Coronavirus 2 by RT PCR NEGATIVE NEGATIVE Final    Comment: (NOTE) SARS-CoV-2 target nucleic acids are NOT DETECTED.  The SARS-CoV-2 RNA is generally detectable in upper respiratory specimens during the acute phase of infection. The lowest concentration of SARS-CoV-2 viral copies this assay can detect is 138 copies/mL. A negative result does not preclude SARS-Cov-2 infection and should not be used as the sole basis for treatment or other patient management decisions. A negative result may occur with  improper specimen collection/handling, submission of specimen other than nasopharyngeal swab, presence of viral mutation(s) within the areas targeted by this assay, and inadequate number of viral copies(<138 copies/mL). A negative result must be combined with clinical  observations, patient history, and epidemiological information. The expected result is Negative.  Fact Sheet for Patients:  BloggerCourse.comhttps://www.fda.gov/media/152166/download  Fact Sheet for Healthcare Providers:  SeriousBroker.ithttps://www.fda.gov/media/152162/download  This test is no t yet approved or cleared by the [place] FDA and  has been authorized for detection and/or diagnosis of SARS-CoV-2 by FDA under an Emergency Use Authorization (EUA). This EUA will remain  in effect (meaning this test can be used) for the duration of the COVID-19 declaration under Section 564(b)(1) of the Act, 21 U.S.C.section 360bbb-3(b)(1), unless the authorization is terminated  or revoked sooner.       Influenza A by PCR NEGATIVE NEGATIVE Final   Influenza B by PCR NEGATIVE NEGATIVE Final    Comment: (NOTE) The Xpert Xpress SARS-CoV-2/FLU/RSV plus assay is intended as an aid in the diagnosis of influenza from Nasopharyngeal swab specimens and should not be used as a sole basis for treatment. Nasal washings and aspirates are unacceptable for Xpert Xpress SARS-CoV-2/FLU/RSV testing.  Fact Sheet for Patients: BloggerCourse.comhttps://www.fda.gov/media/152166/download  Fact Sheet for Healthcare Providers: SeriousBroker.ithttps://www.fda.gov/media/152162/download  This test is not yet approved or cleared by the [place] and has been authorized for detection and/or diagnosis of SARS-CoV-2 by FDA under an Emergency Use Authorization (EUA). This EUA will remain in effect (meaning this test can be used) for the duration of the COVID-19 declaration under Section 564(b)(1) of the Act, 21 U.S.C. section 360bbb-3(b)(1), unless the authorization is terminated or revoked.  Performed at [facility], [address], [place], [place] [address]       Radiology Studies: CT Foot Left Wo Contrast  Result Date: 12/28/2020 CLINICAL DATA:  Foot trauma, Lisfranc suspected, xray done (Age >= 6y) Fall from ladder. EXAM: CT OF THE LEFT FOOT  WITHOUT CONTRAST TECHNIQUE: Multidetector CT imaging of the left foot was performed according to the standard protocol. Multiplanar CT image reconstructions were also generated. COMPARISON:  Radiograph earlier today. FINDINGS: Bones/Joint/Cartilage Homolateral Lisfranc fracture dislocation. There is also dorsal dislocation of the second metatarsal at the tarsal metatarsal joint, with the proximal metatarsal perched on the cuneiform. Comminuted fractures at the base of the second, third, fourth metatarsals with multiple small fracture fragments at the articular surface. Multiple comminuted midfoot fractures. There are comminuted fractures of the medial, mid, and lateral cuneiform at the tarsal metatarsal articular surface with innumerable small fracture fragments. Mildly displaced lateral cuboid fracture. Nondisplaced medial navicular fracture. Mildly displaced distal third metatarsal fracture involving the metatarsal neck. The digits appear intact. Ligaments Suboptimally assessed by CT. Suspected disruption of the Lisfranc ligament. Muscles and Tendons No tendon entrapment at the ankle. Hemorrhage within the intrinsic musculature of the foot. Soft tissues Soft tissue edema is prominent overlying the dorsum of the foot. IMPRESSION: 1. Homolateral Lisfranc fracture dislocation. There is also dorsal dislocation of the second metatarsal at the tarsometatarsal joint, with the proximal metatarsal perched on the mid and lateral cuneiform. 2. Comminuted fractures at the base of the second, third, fourth metatarsals with multiple small fracture fragments at the articular surface. 3. Comminuted midfoot fractures with innumerable small fracture fragments at the tarsal metatarsal articular surface, fractures involve the medial, mid and lateral cuneiform. Lateral cuboid fracture. 4. Nondisplaced medial navicular fracture. 5. Mildly displaced distal third metatarsal shaft fracture involving the metatarsal neck. Electronically  Signed   By: Narda Rutherford M.D.   On: 12/28/2020 19:53   Chest Portable 1 View  Result Date: 12/28/2020 CLINICAL DATA:  50 year old male with preop chest radiograph dated EXAM: PORTABLE CHEST 1 VIEW COMPARISON:  Chest radiograph dated 10/08/2007. FINDINGS: The heart size and mediastinal contours are within normal limits. Both lungs are clear. The visualized skeletal structures are unremarkable. IMPRESSION: No active disease. Electronically Signed   By: Elgie Collard M.D.   On: 12/28/2020 21:35   DG Foot Complete Left  Result Date: 12/28/2020 CLINICAL DATA:  Jumped off of a ladder EXAM: LEFT FOOT - COMPLETE 3+ VIEW COMPARISON:  None. FINDINGS: There is a Lisfranc injury with lateral displacement of the first through fifth TMTs. The second metatarsal is laterally displaced along the intermediate cuneiform by approximately 11 mm. There is posterior displacement of the metatarsals in relation to the tarsal bones. There is a comminuted fracture of the lateral cuboid. There is a comminuted fracture of the lateral cuneiform with lateral displacement of the third TMT by at least 12 mm. Likely comminuted fracture of the intermediate cuneiform. Lucency along the medial aspect of the navicular bone may reflect an additional nondisplaced fracture. Soft tissue edema. No unexpected radiopaque foreign body. IMPRESSION: 1. Lisfranc injury with  lateral and posterior dislocation of the first through fifth TMTs. 2. Comminuted fracture of the lateral cuboid, lateral and likely intermediate cuneiform. 3. Possible nondisplaced fracture of the medial navicular bone. Electronically Signed   By: Meda Klinefelter MD   On: 12/28/2020 18:14    Scheduled Meds: . atorvastatin  40 mg Oral Daily  . citalopram  20 mg Oral Daily  . enoxaparin (LOVENOX) injection  40 mg Subcutaneous Q24H  . hydrochlorothiazide  12.5 mg Oral Daily  . losartan  50 mg Oral Daily  . metaxalone  800 mg Oral TID  . niacin  500 mg Oral QHS    Continuous Infusions: .  ceFAZolin (ANCEF) IV       LOS: 1 day   Time spent: 40 minutes   Teng Decou Estill Cotta, MD [facility]  If 7PM-7AM, please contact night-coverage [email or web address] 12/29/2020, 9:26 AM

## 2020-12-29 NOTE — Progress Notes (Signed)
PT Cancellation Note  Patient Details Name: Dennis Francis MRN: [id number] DOB: 05/18/1971   Cancelled Treatment:    Reason Eval/Treat Not Completed: Medical issues which prohibited therapy Pt to have ORIF for L Lisfranc fracture 1/9, spoke with nurse and medical staff about completing current PT orders and will await new orders post sx with [facility] clarification.  Expect to initiate PT POD1, 12/30/20/  Malachi Pro, DPT 12/29/2020, 10:23 AM

## 2020-12-29 NOTE — Progress Notes (Signed)
Daily Progress Note   Subjective  - Day of Surgery  F/u left foot lisfranc fracture.  Objective Vitals:   12/28/20 1735 12/28/20 1738 12/29/20 0604  BP: (!) 145/91  140/69  Pulse: (!) 56  62  Resp: 18  18  Temp: 97.6 F (36.4 C)    TempSrc: Oral    SpO2: 98%  99%  Weight:  98 kg   Height:  5\' 8"  (1.727 m)     Physical Exam: Pt with large fracture blister on dorsal foot at surgical site.  Laboratory CBC    Component Value Date/Time   WBC 8.9 12/29/2020 0019   HGB 13.7 12/29/2020 0019   HGB 14.8 08/14/2014 2312   HCT 38.2 (L) 12/29/2020 0019   HCT 42.7 08/14/2014 2312   PLT 150 12/29/2020 0019   PLT 133 (L) 08/14/2014 2312    BMET    Component Value Date/Time   NA 140 12/29/2020 0019   NA 141 08/14/2014 2312   K 4.0 12/29/2020 0019   K 3.8 08/14/2014 2312   CL 102 12/29/2020 0019   CL 105 08/14/2014 2312   CO2 30 12/29/2020 0019   CO2 28 08/14/2014 2312   GLUCOSE 129 (H) 12/29/2020 0019   GLUCOSE 104 (H) 08/14/2014 2312   BUN 18 12/29/2020 0019   BUN 20 (H) 08/14/2014 2312   CREATININE 1.26 (H) 12/29/2020 0019   CREATININE 1.11 08/14/2014 2312   CALCIUM 9.8 12/29/2020 0019   CALCIUM 9.1 08/14/2014 2312   GFRNONAA >60 12/29/2020 0019   GFRNONAA >60 08/14/2014 2312   GFRAA >60 02/20/2017 0732   GFRAA >60 08/14/2014 2312    Assessment/Planning: Lisfranc fracture with dislocation   Will d/c surgery today and f/u in outpt clinic.  Will plan for surgery once skin is stable.  Replaced compression dressing and splint.  Strict NWB and elevation upon d/c  F/u with me this week for dressing/splint change.  2313 A  12/29/2020, 12:04 PM

## 2020-12-29 NOTE — TOC Initial Note (Signed)
Transition of Care [facility]) - Initial/Assessment Note    Patient Details  Name: Dennis Francis MRN: [id number] Date of Birth: 11-10-1971  Transition of Care [facility]) CM/SW Contact:    Larwance Rote, LCSW Phone Number: 12/29/2020, 3:07 PM  Clinical Narrative:   Patient discharged home with self care.   3 in 1 bedside commode delivered to the patient's bedside.             Expected Discharge Plan: Home/Self Care Barriers to Discharge: No Barriers Identified   Patient Goals and CMS Choice   CMS Medicare.gov Compare Post Acute Care list provided to:: Patient Choice offered to / list presented to : Patient  Expected Discharge Plan and Services Expected Discharge Plan: Home/Self Care In-house Referral: Clinical Social Work     Living arrangements for the past 2 months: Single Family Home Expected Discharge Date: 12/29/20                 DME Agency: [employer] Date DME Agency Contacted: 12/29/20 Time DME Agency Contacted: [phone]              Prior Living Arrangements/Services Living arrangements for the past 2 months: Single Family Home Lives with:: Self Patient language and need for interpreter reviewed:: No Do you feel safe going back to the place where you live?: No      Need for Family Participation in Patient Care: No (Comment) Care giver support system in place?: Yes (comment)   Criminal Activity/Legal Involvement Pertinent to Current Situation/Hospitalization: No - Comment as needed  Activities of Daily Living      Permission Sought/Granted   Permission granted to share information with : Yes, Verbal Permission Granted              Emotional Assessment Appearance:: Appears older than stated age Attitude/Demeanor/Rapport: Engaged   Orientation: : Oriented to Self,Oriented to Place,Oriented to  Time,Oriented to Situation Alcohol / Substance Use: Not Applicable Psych Involvement: No (comment)  Admission diagnosis:  Fracture of left foot, closed,  initial encounter [S92.902A] Lisfranc's dislocation, left, initial encounter [S93.325A] Lisfranc dislocation, left, initial encounter [S93.325A] Unspecified fracture of left foot, initial encounter for closed fracture [S92.902A] Patient Active Problem List   Diagnosis Date Noted  . Unspecified fracture of left foot, initial encounter for closed fracture 12/29/2020  . Lisfranc dislocation, left, initial encounter 12/28/2020   PCP:  Dione Housekeeper, MD Pharmacy:   [facility] [address] [place] - [address] [place] [address] Phone: [phone] Fax: [phone]     Social Determinants of Health (SDOH) Interventions    Readmission Risk Interventions No flowsheet data found.

## 2020-12-29 NOTE — ED Notes (Signed)
Patient taken to decon shower to assist with urinal.

## 2020-12-29 NOTE — ED Notes (Signed)
Patient resting quietly with eyes closed in no acute distress.  

## 2020-12-29 NOTE — Discharge Summary (Addendum)
Physician Discharge Summary  Dennis Francis FAO:[id number] DOB: 02/13/1971 DOA: 12/28/2020  PCP: Dennis Francis  Admit date: 12/28/2020 Discharge date: 12/29/2020  Admitted From: Home Disposition:  Home  Recommendations for Outpatient Follow-up:  1. Follow-up with podiatry outpatient for dressing/splint change 2. Strict NWB   Home Health: None Equipment/Devices: None Discharge Condition: Stable CODE STATUS: Full code Diet recommendation: Low-sodium diet  Brief/Interim Summary: Dennis Francis an 50 y.o.malewith history of hypertension, tobacco use, hyperlipidemia who presented to the emergency room on account of left foot pain. Patient had a fall from a 10 foot height when he missed a step on a ladder. Patient landed on his left foot and has been unable to walk on foot since due to severe pain.   In the ER, imaging studies done did reveal evidence of Lisfranc fracture. Patient was seen in the ED by podiatrist with plan for surgical intervention scheduled on 1/9.  Acute left lower extremity Lisfranc fracture with dislocation: -Secondary to mechanical fall. -Reviewed x-ray and CT of left foot. -Continued as needed pain medication -Evaluated by podiatrist Dennis Francis was scheduled for surgery this morning however surgery got canceled due to fracture blister on dorsum of left foot. -Podiatry recommended to DC patient home today &   follow-up outpatient next week with a strict NWB.  Essential hypertension: Blood pressure remained stable on home medications-losartan and HCTZ  Hyperlipidemia: Continued statin  Depression: Continued Celexa  Tobacco abuse: Patient chewed tobacco -Denies alcohol, illicit khakis. Counseled about cessation  Obesity with BMI of 32: -Diet modification/exercise and weight loss recommended.  Discharge Diagnoses:  Acute left lower extremity Lisfranc fracture Essential hypertension Hyperlipidemia Depression Tobacco abuse Obesity  with BMI of 32  Discharge Instructions  Discharge Instructions    Diet - low sodium heart healthy   Complete by: As directed    Discharge instructions   Complete by: As directed    Follow-up with podiatry next week Nonweightbearing-left foot     Allergies as of 12/29/2020   No Known Allergies     Medication List    TAKE these medications   atorvastatin 40 MG tablet Commonly known as: LIPITOR Take 40 mg by mouth daily.   citalopram 20 MG tablet Commonly known as: CELEXA Take 20 mg by mouth daily.   hydrochlorothiazide 12.5 MG tablet Commonly known as: HYDRODIURIL Take 12.5 mg by mouth daily.   losartan 50 MG tablet Commonly known as: COZAAR Take 50 mg by mouth 2 (two) times daily.   niacin 500 MG tablet Take 500 mg by mouth daily.       Follow-up Information    Dennis Francis Follow up.   Specialty: Podiatry Why: Pt to f/u with me either Wednesday in [place] or Thursday in [place]. Contact information: [address] [place] [address] [phone]              No Known Allergies  Consultations:  podiatry   Procedures/Studies: CT Foot Left Wo Contrast  Result Date: 12/28/2020 CLINICAL DATA:  Foot trauma, Lisfranc suspected, xray done (Age >= 6y) Fall from ladder. EXAM: CT OF THE LEFT FOOT WITHOUT CONTRAST TECHNIQUE: Multidetector CT imaging of the left foot was performed according to the standard protocol. Multiplanar CT image reconstructions were also generated. COMPARISON:  Radiograph earlier today. FINDINGS: Bones/Joint/Cartilage Homolateral Lisfranc fracture dislocation. There is also dorsal dislocation of the second metatarsal at the tarsal metatarsal joint, with the proximal metatarsal perched on the cuneiform. Comminuted fractures at the base of the second,  third, fourth metatarsals with multiple small fracture fragments at the articular surface. Multiple comminuted midfoot fractures. There are comminuted fractures of the medial,  mid, and lateral cuneiform at the tarsal metatarsal articular surface with innumerable small fracture fragments. Mildly displaced lateral cuboid fracture. Nondisplaced medial navicular fracture. Mildly displaced distal third metatarsal fracture involving the metatarsal neck. The digits appear intact. Ligaments Suboptimally assessed by CT. Suspected disruption of the Lisfranc ligament. Muscles and Tendons No tendon entrapment at the ankle. Hemorrhage within the intrinsic musculature of the foot. Soft tissues Soft tissue edema is prominent overlying the dorsum of the foot. IMPRESSION: 1. Homolateral Lisfranc fracture dislocation. There is also dorsal dislocation of the second metatarsal at the tarsometatarsal joint, with the proximal metatarsal perched on the mid and lateral cuneiform. 2. Comminuted fractures at the base of the second, third, fourth metatarsals with multiple small fracture fragments at the articular surface. 3. Comminuted midfoot fractures with innumerable small fracture fragments at the tarsal metatarsal articular surface, fractures involve the medial, mid and lateral cuneiform. Lateral cuboid fracture. 4. Nondisplaced medial navicular fracture. 5. Mildly displaced distal third metatarsal shaft fracture involving the metatarsal neck. Electronically Signed   By: Narda RutherfordMelanie  Sanford M.D.   On: 12/28/2020 19:53   Chest Portable 1 View  Result Date: 12/28/2020 CLINICAL DATA:  [age] year old male with preop chest radiograph dated EXAM: PORTABLE CHEST 1 VIEW COMPARISON:  Chest radiograph dated 10/08/2007. FINDINGS: The heart size and mediastinal contours are within normal limits. Both lungs are clear. The visualized skeletal structures are unremarkable. IMPRESSION: No active disease. Electronically Signed   By: Elgie CollardArash  Radparvar M.D.   On: 12/28/2020 21:35   DG Foot Complete Left  Result Date: 12/28/2020 CLINICAL DATA:  Jumped off of a ladder EXAM: LEFT FOOT - COMPLETE 3+ VIEW COMPARISON:  None. FINDINGS:  There is a Lisfranc injury with lateral displacement of the first through fifth TMTs. The second metatarsal is laterally displaced along the intermediate cuneiform by approximately 11 mm. There is posterior displacement of the metatarsals in relation to the tarsal bones. There is a comminuted fracture of the lateral cuboid. There is a comminuted fracture of the lateral cuneiform with lateral displacement of the third TMT by at least 12 mm. Likely comminuted fracture of the intermediate cuneiform. Lucency along the medial aspect of the navicular bone may reflect an additional nondisplaced fracture. Soft tissue edema. No unexpected radiopaque foreign body. IMPRESSION: 1. Lisfranc injury with lateral and posterior dislocation of the first through fifth TMTs. 2. Comminuted fracture of the lateral cuboid, lateral and likely intermediate cuneiform. 3. Possible nondisplaced fracture of the medial navicular bone. Electronically Signed   By: Meda KlinefelterStephanie  Peacock Francis   On: 12/28/2020 18:14       Subjective: Patient seen and examined in the ED.  Has a splint in left foot.  Complaining of mild pain.  Denies any other symptoms including headache, blurry vision, lightheadedness, dizziness, nausea, vomiting, chest pain or shortness of breath.  Discharge Exam: Vitals:   12/28/20 1735 12/29/20 0604  BP: (!) 145/91 140/69  Pulse: (!) 56 62  Resp: 18 18  Temp: 97.6 F (36.4 C)   SpO2: 98% 99%   Vitals:   12/28/20 1735 12/28/20 1738 12/29/20 0604  BP: (!) 145/91  140/69  Pulse: (!) 56  62  Resp: 18  18  Temp: 97.6 F (36.4 C)    TempSrc: Oral    SpO2: 98%  99%  Weight:  98 kg   Height:  5\' 8"  (1.727  m)     General: Pt is alert, awake, not in acute distress Cardiovascular: RRR, S1/S2 +, no rubs, no gallops Respiratory: CTA bilaterally, no wheezing, no rhonchi Abdominal: Soft, NT, ND, bowel sounds + Extremities: Splint in left foot.    The results of significant diagnostics from this hospitalization  (including imaging, microbiology, ancillary and laboratory) are listed below for reference.     Microbiology: Recent Results (from the past 240 hour(s))  Resp Panel by RT-PCR (Flu A&B, Covid) Nasopharyngeal Swab     Status: None   Collection Time: 12/29/20  3:38 AM   Specimen: Nasopharyngeal Swab; Nasopharyngeal(NP) swabs in vial transport medium  Result Value Ref Range Status   SARS Coronavirus 2 by RT PCR NEGATIVE NEGATIVE Final    Comment: (NOTE) SARS-CoV-2 target nucleic acids are NOT DETECTED.  The SARS-CoV-2 RNA is generally detectable in upper respiratory specimens during the acute phase of infection. The lowest concentration of SARS-CoV-2 viral copies this assay can detect is 138 copies/mL. A negative result does not preclude SARS-Cov-2 infection and should not be used as the sole basis for treatment or other patient management decisions. A negative result may occur with  improper specimen collection/handling, submission of specimen other than nasopharyngeal swab, presence of viral mutation(s) within the areas targeted by this assay, and inadequate number of viral copies(<138 copies/mL). A negative result must be combined with clinical observations, patient history, and epidemiological information. The expected result is Negative.  Fact Sheet for Patients:  BloggerCourse.com  Fact Sheet for Healthcare Providers:  SeriousBroker.it  This test is no t yet approved or cleared by the [place] FDA and  has been authorized for detection and/or diagnosis of SARS-CoV-2 by FDA under an Emergency Use Authorization (EUA). This EUA will remain  in effect (meaning this test can be used) for the duration of the COVID-19 declaration under Section 564(b)(1) of the Act, 21 U.S.C.section 360bbb-3(b)(1), unless the authorization is terminated  or revoked sooner.       Influenza A by PCR NEGATIVE NEGATIVE Final   Influenza B by PCR  NEGATIVE NEGATIVE Final    Comment: (NOTE) The Xpert Xpress SARS-CoV-2/FLU/RSV plus assay is intended as an aid in the diagnosis of influenza from Nasopharyngeal swab specimens and should not be used as a sole basis for treatment. Nasal washings and aspirates are unacceptable for Xpert Xpress SARS-CoV-2/FLU/RSV testing.  Fact Sheet for Patients: BloggerCourse.com  Fact Sheet for Healthcare Providers: SeriousBroker.it  This test is not yet approved or cleared by the [place] FDA and has been authorized for detection and/or diagnosis of SARS-CoV-2 by FDA under an Emergency Use Authorization (EUA). This EUA will remain in effect (meaning this test can be used) for the duration of the COVID-19 declaration under Section 564(b)(1) of the Act, 21 U.S.C. section 360bbb-3(b)(1), unless the authorization is terminated or revoked.  Performed at [facility], [address] [place], [place] [address]      Labs: BNP (last 3 results) No results for input(s): BNP in the last 8760 hours. Basic Metabolic Panel: Recent Labs  Lab 12/29/20 0019  NA 140  K 4.0  CL 102  CO2 30  GLUCOSE 129*  BUN 18  CREATININE 1.26*  CALCIUM 9.8   Liver Function Tests: No results for input(s): AST, ALT, ALKPHOS, BILITOT, PROT, ALBUMIN in the last 168 hours. No results for input(s): LIPASE, AMYLASE in the last 168 hours. No results for input(s): AMMONIA in the last 168 hours. CBC: Recent Labs  Lab 12/29/20  0019  WBC 8.9  NEUTROABS 6.1  HGB 13.7  HCT 38.2*  MCV 86.0  PLT 150   Cardiac Enzymes: No results for input(s): CKTOTAL, CKMB, CKMBINDEX, TROPONINI in the last 168 hours. BNP: Invalid input(s): POCBNP CBG: No results for input(s): GLUCAP in the last 168 hours. D-Dimer No results for input(s): DDIMER in the last 72 hours. Hgb A1c No results for input(s): HGBA1C in the last 72 hours. Lipid Profile No results for input(s):  CHOL, HDL, LDLCALC, TRIG, CHOLHDL, LDLDIRECT in the last 72 hours. Thyroid function studies No results for input(s): TSH, T4TOTAL, T3FREE, THYROIDAB in the last 72 hours.  Invalid input(s): FREET3 Anemia work up No results for input(s): VITAMINB12, FOLATE, FERRITIN, TIBC, IRON, RETICCTPCT in the last 72 hours. Urinalysis    Component Value Date/Time   COLORURINE YELLOW 08/13/2015 1501   APPEARANCEUR CLEAR 08/13/2015 1501   LABSPEC 1.020 08/13/2015 1501   PHURINE 5.5 08/13/2015 1501   GLUCOSEU NEGATIVE 08/13/2015 1501   HGBUR NEGATIVE 08/13/2015 1501   BILIRUBINUR NEGATIVE 08/13/2015 1501   KETONESUR NEGATIVE 08/13/2015 1501   PROTEINUR NEGATIVE 08/13/2015 1501   NITRITE NEGATIVE 08/13/2015 1501   LEUKOCYTESUR NEGATIVE 08/13/2015 1501   Sepsis Labs Invalid input(s): PROCALCITONIN,  WBC,  LACTICIDVEN Microbiology Recent Results (from the past 240 hour(s))  Resp Panel by RT-PCR (Flu A&B, Covid) Nasopharyngeal Swab     Status: None   Collection Time: 12/29/20  3:38 AM   Specimen: Nasopharyngeal Swab; Nasopharyngeal(NP) swabs in vial transport medium  Result Value Ref Range Status   SARS Coronavirus 2 by RT PCR NEGATIVE NEGATIVE Final    Comment: (NOTE) SARS-CoV-2 target nucleic acids are NOT DETECTED.  The SARS-CoV-2 RNA is generally detectable in upper respiratory specimens during the acute phase of infection. The lowest concentration of SARS-CoV-2 viral copies this assay can detect is 138 copies/mL. A negative result does not preclude SARS-Cov-2 infection and should not be used as the sole basis for treatment or other patient management decisions. A negative result may occur with  improper specimen collection/handling, submission of specimen other than nasopharyngeal swab, presence of viral mutation(s) within the areas targeted by this assay, and inadequate number of viral copies(<138 copies/mL). A negative result must be combined with clinical observations, patient history,  and epidemiological information. The expected result is Negative.  Fact Sheet for Patients:  BloggerCourse.com  Fact Sheet for Healthcare Providers:  SeriousBroker.it  This test is no t yet approved or cleared by the [place] FDA and  has been authorized for detection and/or diagnosis of SARS-CoV-2 by FDA under an Emergency Use Authorization (EUA). This EUA will remain  in effect (meaning this test can be used) for the duration of the COVID-19 declaration under Section 564(b)(1) of the Act, 21 U.S.C.section 360bbb-3(b)(1), unless the authorization is terminated  or revoked sooner.       Influenza A by PCR NEGATIVE NEGATIVE Final   Influenza B by PCR NEGATIVE NEGATIVE Final    Comment: (NOTE) The Xpert Xpress SARS-CoV-2/FLU/RSV plus assay is intended as an aid in the diagnosis of influenza from Nasopharyngeal swab specimens and should not be used as a sole basis for treatment. Nasal washings and aspirates are unacceptable for Xpert Xpress SARS-CoV-2/FLU/RSV testing.  Fact Sheet for Patients: BloggerCourse.com  Fact Sheet for Healthcare Providers: SeriousBroker.it  This test is not yet approved or cleared by the [place] FDA and has been authorized for detection and/or diagnosis of SARS-CoV-2 by FDA under an Emergency Use Authorization (EUA). This EUA will  remain in effect (meaning this test can be used) for the duration of the COVID-19 declaration under Section 564(b)(1) of the Act, 21 U.S.C. section 360bbb-3(b)(1), unless the authorization is terminated or revoked.  Performed at [facility], [address] [place], [place] [id number]      Time coordinating discharge: Over 30 minutes  SIGNED:   Ollen Bowlinka R Kiyoshi Schaab, Francis  [facility] 12/29/2020, 12:15 PM Pager   If 7PM-7AM, please contact night-coverage [email or web address]

## 2020-12-29 NOTE — ED Notes (Signed)
Pt resting in bed. Denies pain at this time. Updated on plan for surgery at 1115.

## 2020-12-29 NOTE — Evaluation (Signed)
Physical Therapy Evaluation Patient Details Name: Dennis Francis MRN: [id number] DOB: [birth year] Today's Date: 12/29/2020   History of Present Illness  50 y/o male who fell 10 ft off ladder suffering L foot Lisfranc fracture.  Was to have ORIF repair this date but cancelled by podiatrist 2/2 skin condition issues; parently rescheduled in ~2 weeks.  Seen for gait training and education prior to discharge.  Clinical Impression  Pt did well with PT exam.  He did initially have some hesitancy, unsteadiness with crutches, but showed improvement with increased cuing and reinforcement.  Similarly he was able to do the steps while maintaining NWBing but needed cues to slow and be more deliberate to insure that he does try to do too much too fast.  Wife present during gait training and also educated on appropriate strategy and cues.  Discussed activity limitations and safety considerations in return to home as well as education on ADLs, DME and likely course of recovery (pre and post surgery).      Follow Up Recommendations Follow surgeon's recommendation for DC plan and follow-up therapies (supervision for mobility initially)    Equipment Recommendations  3in1 (PT);Crutches    Recommendations for Other Services       Precautions / Restrictions Precautions Precautions: Fall Restrictions Weight Bearing Restrictions: Yes LLE Weight Bearing: Non weight bearing      Mobility  Bed Mobility Overal bed mobility: Independent             General bed mobility comments: Pt able to get supine to sit w/o issue    Transfers Overall transfer level: Modified independent Equipment used: Rolling walker (2 wheeled);Crutches             General transfer comment: We performed with both walker and crutches, pt able to rise w/o physical assit with each, but did require some increased cuing and guidance to insure safety.  Ambulation/Gait Ambulation/Gait assistance: Min guard Gait Distance (Feet):  35 Feet Assistive device: Crutches       General Gait Details: 35 ft with crutches, guarded hop-to cadence.  Pt able to maintain NWBing but did have a few episodes of some unsteadiness that needed close CGA and cuing to insure safety.  Later we trialed with walker and he was safer (he feels like there is a family member's walker available should he decide to use that initially)  Stairs Stairs: Yes Stairs assistance: Min guard Stair Management: Two rails;Forwards Number of Stairs: 4 General stair comments: Pt was able to ascend and descend steps w/o direct assist, showed good effort with improved foot placement and control after extra cuing and reinforcement (first step was more of a hop-and-hope than a lift-and-place situation)  Wheelchair Mobility    Modified Rankin (Stroke Patients Only)       Balance Overall balance assessment: Modified Independent (close guarding needed with crutch ambulation, but no overt LOBs)                                           Pertinent Vitals/Pain Pain Assessment: 0-10 Pain Score: 6  Pain Location: L mid foot    Home Living Family/patient expects to be discharged to:: Private residence Living Arrangements: Spouse/significant other;Children Available Help at Discharge: Family;Available PRN/intermittently (wife works days, son works nights)   Home Access: Stairs to enter Science writer: Can reach both Secretary/administrator of Steps: 3 Home Layout: Able to live  on main level with bedroom/bathroom Home Equipment: None (may have access to walker)      Prior Function Level of Independence: Independent         Comments: Pt works at [employer], active and independent at baseline     [employer]        Extremity/Trunk Assessment   Upper Extremity Assessment Upper Extremity Assessment: Overall WFL for tasks assessed    Lower Extremity Assessment Lower Extremity Assessment: Overall WFL for tasks  assessed       Communication   Communication: No difficulties  Cognition Arousal/Alertness: Awake/alert Behavior During Therapy: WFL for tasks assessed/performed Overall Cognitive Status: Within Functional Limits for tasks assessed                                 General Comments: Pt alert to situation, did need repeated clarification with some mobility/gait/stair/safety training      General Comments      Exercises     Assessment/Plan    PT Assessment Patient needs continued PT services  PT Problem List Decreased strength;Decreased range of motion;Decreased activity tolerance;Decreased balance;Decreased mobility;Decreased coordination;Decreased knowledge of use of DME;Decreased safety awareness;Cardiopulmonary status limiting activity;Pain;Decreased knowledge of precautions       PT Treatment Interventions DME instruction;Gait training;Stair training;Functional mobility training;Therapeutic activities;Therapeutic exercise;Balance training;Patient/family education;Neuromuscular re-education    PT Goals (Current goals can be found in the Care Plan section)  Acute Rehab PT Goals Patient Stated Goal: go home PT Goal Formulation: With patient/family Time For Goal Achievement: 01/12/21 Potential to Achieve Goals: Good    Frequency Min 2X/week   Barriers to discharge        Co-evaluation               AM-PAC PT "6 Clicks" Mobility  Outcome Measure Help needed turning from your back to your side while in a flat bed without using bedrails?: None Help needed moving from lying on your back to sitting on the side of a flat bed without using bedrails?: None Help needed moving to and from a bed to a chair (including a wheelchair)?: A Little Help needed standing up from a chair using your arms (e.g., wheelchair or bedside chair)?: A Little Help needed to walk in hospital room?: A Little Help needed climbing 3-5 steps with a railing? : A Little 6 Click Score:  20    End of Session Equipment Utilized During Treatment: Gait belt Activity Tolerance: Patient limited by fatigue;Patient tolerated treatment well Patient left: in chair;with call bell/phone within reach;with nursing/sitter in room;with family/visitor present Nurse Communication: Mobility status;Weight bearing status PT Visit Diagnosis: Muscle weakness (generalized) (M62.81);Difficulty in walking, not elsewhere classified (R26.2);Unsteadiness on feet (R26.81)    Time: 1330-1405 PT Time Calculation (min) (ACUTE ONLY): 35 min   Charges:   PT Evaluation $PT Eval Low Complexity: 1 Low PT Treatments $Gait Training: 8-22 mins        Malachi Pro, DPT 12/29/2020, 3:20 PM

## 2020-12-29 NOTE — Progress Notes (Signed)
OT Cancellation Note  Patient Details Name: Jayke Caul MRN: [id number] DOB: [birth year]   Cancelled Treatment:    Reason Eval/Treat Not Completed: Patient at procedure or test/ unavailable   Pt scheduled for ORIF for L Lisfranc fracture 1/9.  Will defer OT evaluation this date until pt is post-op and weight bearing orders are in place.  Kathyrn Drown Claryce Friel, OTR/L 12/29/20, 10:45 AM

## 2021-01-06 ENCOUNTER — Other Ambulatory Visit
Admission: RE | Admit: 2021-01-06 | Discharge: 2021-01-06 | Disposition: A | Discharge status: Home / Self Care | Payer: BC Managed Care – PPO | Source: Ambulatory Visit | Attending: Surgery | Admitting: Surgery

## 2021-01-06 ENCOUNTER — Other Ambulatory Visit: Payer: Self-pay

## 2021-01-06 DIAGNOSIS — U071 COVID-19: Secondary | ICD-10-CM | POA: Insufficient documentation

## 2021-01-06 DIAGNOSIS — Z01818 Encounter for other preprocedural examination: Secondary | ICD-10-CM | POA: Insufficient documentation

## 2021-01-06 NOTE — Patient Instructions (Signed)
Your procedure is scheduled on: Friday January 10, 2021. Report to Day Surgery inside [facility] 2nd floor (stop by registration desk first before going upstairs). To find out your arrival time please call [phone] between 1PM - 3PM on Thursday January 09, 2021.  Remember: Instructions that are not followed completely may result in serious medical risk,  up to and including death, or upon the discretion of your surgeon and anesthesiologist your  surgery may need to be rescheduled.     _X__ 1. Do not eat food after midnight the night before your procedure.                 No chewing gum or hard candies. You may drink clear liquids up to 2 hours                 before you are scheduled to arrive for your surgery- DO not drink clear                 liquids within 2 hours of the start of your surgery.                 Clear Liquids include:  water, apple juice without pulp, clear Gatorade, G2 or                  Gatorade Zero (avoid Red/Purple/Blue), Black Coffee or Tea (Do not add                 anything to coffee or tea).  __X__2.  On the morning of surgery brush your teeth with toothpaste and water, you                may rinse your mouth with mouthwash if you wish.  Do not swallow any toothpaste of mouthwash.     _X__ 3.  No Alcohol for 24 hours before or after surgery.   _X__ 4.  Do Not Smoke or use e-cigarettes For 24 Hours Prior to Your Surgery.                 Do not use any chewable tobacco products for at least 6 hours prior to                 Surgery.  _X__  5.  Do not use any recreational drugs (marijuana, cocaine, heroin, ecstasy, MDMA or other)                For at least one week prior to your surgery.  Combination of these drugs with anesthesia                May have life threatening results.  __X__ 6.  Notify your doctor if there is any change in your medical condition      (cold, fever, infections).     Do not wear jewelry, make-up,  hairpins, clips or nail polish. Do not wear lotions, powders, or perfumes. You may wear deodorant. Do not shave 48 hours prior to surgery. Men may shave face and neck. Do not bring valuables to the hospital.    [facility] is not responsible for any belongings or valuables.  Contacts, dentures or bridgework may not be worn into surgery. Leave your suitcase in the car. After surgery it may be brought to your room. For patients admitted to the hospital, discharge time is determined by your treatment team.   Patients discharged the day of surgery will not be allowed to drive home.  Make arrangements for someone to be with you for the first 24 hours of your Same Day Discharge.   __X__ Take these medicines the morning of surgery with A SIP OF WATER:    1. citalopram (CELEXA) 20 MG   2. oxyCODONE-acetaminophen (PERCOCET) 5-325 MG   __x__ Use CHG Soap (or wipes) as directed  ____ Use Benzoyl Peroxide Gel as instructed  ____ Use inhalers on the day of surgery  ____ Stop metformin 2 days prior to surgery    __x__ Stop Anti-inflammatories    __x__ Stop supplements until after surgery.    __x__ Bring C-Pap to the hospital.    If you have any questions regarding your pre-procedure instructions,  Please call Pre-admit Testing at [phone].

## 2021-01-07 ENCOUNTER — Other Ambulatory Visit
Admission: RE | Admit: 2021-01-07 | Discharge: 2021-01-07 | Disposition: A | Discharge status: Home / Self Care | Payer: BC Managed Care – PPO | Source: Ambulatory Visit | Attending: Podiatry | Admitting: Podiatry

## 2021-01-07 HISTORY — DX: Anxiety disorder, unspecified: F41.9

## 2021-01-07 HISTORY — DX: Personal history of urinary calculi: Z87.442

## 2021-01-08 ENCOUNTER — Other Ambulatory Visit: Payer: Self-pay | Admitting: Podiatry

## 2021-01-08 ENCOUNTER — Other Ambulatory Visit: Payer: Self-pay

## 2021-01-08 ENCOUNTER — Other Ambulatory Visit
Admission: RE | Admit: 2021-01-08 | Discharge: 2021-01-08 | Disposition: A | Discharge status: Home / Self Care | Payer: BC Managed Care – PPO | Source: Ambulatory Visit | Attending: Podiatry | Admitting: Podiatry

## 2021-01-08 DIAGNOSIS — Z01818 Encounter for other preprocedural examination: Secondary | ICD-10-CM | POA: Diagnosis not present

## 2021-01-08 DIAGNOSIS — U071 COVID-19: Secondary | ICD-10-CM | POA: Diagnosis not present

## 2021-01-08 LAB — SURGICAL PCR SCREEN
MRSA, PCR: NEGATIVE
Staphylococcus aureus: POSITIVE — AB

## 2021-01-08 LAB — SARS CORONAVIRUS 2 (TAT 6-24 HRS): SARS Coronavirus 2: POSITIVE — AB

## 2021-01-10 ENCOUNTER — Encounter: Payer: Self-pay | Admitting: Podiatry

## 2021-01-10 ENCOUNTER — Ambulatory Visit: Payer: BC Managed Care – PPO | Admitting: Anesthesiology

## 2021-01-10 ENCOUNTER — Encounter
Admission: RE | Disposition: A | Discharge status: Home / Self Care | Payer: Self-pay | Source: Home / Self Care | Attending: Podiatry

## 2021-01-10 ENCOUNTER — Other Ambulatory Visit: Payer: Self-pay

## 2021-01-10 ENCOUNTER — Ambulatory Visit
Admission: RE | Admit: 2021-01-10 | Discharge: 2021-01-10 | Disposition: A | Discharge status: Home / Self Care | Payer: BC Managed Care – PPO | Attending: Podiatry | Admitting: Podiatry

## 2021-01-10 DIAGNOSIS — S93325A Dislocation of tarsometatarsal joint of left foot, initial encounter: Secondary | ICD-10-CM | POA: Diagnosis not present

## 2021-01-10 DIAGNOSIS — Z833 Family history of diabetes mellitus: Secondary | ICD-10-CM | POA: Insufficient documentation

## 2021-01-10 DIAGNOSIS — Z8249 Family history of ischemic heart disease and other diseases of the circulatory system: Secondary | ICD-10-CM | POA: Diagnosis not present

## 2021-01-10 DIAGNOSIS — X58XXXA Exposure to other specified factors, initial encounter: Secondary | ICD-10-CM | POA: Diagnosis not present

## 2021-01-10 DIAGNOSIS — Z79899 Other long term (current) drug therapy: Secondary | ICD-10-CM | POA: Insufficient documentation

## 2021-01-10 DIAGNOSIS — Z87891 Personal history of nicotine dependence: Secondary | ICD-10-CM | POA: Diagnosis not present

## 2021-01-10 HISTORY — PX: OPEN REDUCTION INTERNAL FIXATION (ORIF) FOOT LISFRANC FRACTURE: SHX5990

## 2021-01-10 SURGERY — OPEN REDUCTION INTERNAL FIXATION (ORIF) FOOT LISFRANC FRACTURE
Anesthesia: General | Site: Foot | Laterality: Left

## 2021-01-10 MED ORDER — ROCURONIUM BROMIDE 10 MG/ML (PF) SYRINGE
PREFILLED_SYRINGE | INTRAVENOUS | Status: AC
  Filled 2021-01-10: qty 10

## 2021-01-10 MED ORDER — DEXMEDETOMIDINE (PRECEDEX) IN NS 20 MCG/5ML (4 MCG/ML) IV SYRINGE
PREFILLED_SYRINGE | INTRAVENOUS | Status: AC
  Filled 2021-01-10: qty 5

## 2021-01-10 MED ORDER — LIDOCAINE HCL (PF) 2 % IJ SOLN
INTRAMUSCULAR | Status: AC
  Filled 2021-01-10: qty 5

## 2021-01-10 MED ORDER — ACETAMINOPHEN 10 MG/ML IV SOLN
INTRAVENOUS | Status: AC
  Filled 2021-01-10: qty 100

## 2021-01-10 MED ORDER — OXYCODONE-ACETAMINOPHEN 5-325 MG PO TABS: 1.0000 | ORAL_TABLET | Freq: Four times a day (QID) | ORAL | 0 refills | Status: AC | PRN

## 2021-01-10 MED ORDER — PROPOFOL 10 MG/ML IV BOLUS
INTRAVENOUS | Status: AC
  Filled 2021-01-10: qty 20

## 2021-01-10 MED ORDER — FAMOTIDINE 20 MG PO TABS
ORAL_TABLET | ORAL | Status: AC
  Administered 2021-01-10: 20 mg via ORAL
  Filled 2021-01-10: qty 1

## 2021-01-10 MED ORDER — ONDANSETRON HCL 4 MG/2ML IJ SOLN
INTRAMUSCULAR | Status: AC
  Filled 2021-01-10: qty 2

## 2021-01-10 MED ORDER — SUCCINYLCHOLINE CHLORIDE 20 MG/ML IJ SOLN
INTRAMUSCULAR | Status: DC | PRN
  Administered 2021-01-10: 100 mg via INTRAVENOUS

## 2021-01-10 MED ORDER — FENTANYL CITRATE (PF) 100 MCG/2ML IJ SOLN
INTRAMUSCULAR | Status: DC | PRN
  Administered 2021-01-10: 100 ug via INTRAVENOUS
  Administered 2021-01-10: 50 ug via INTRAVENOUS
  Administered 2021-01-10: 100 ug via INTRAVENOUS

## 2021-01-10 MED ORDER — PHENYLEPHRINE HCL (PRESSORS) 10 MG/ML IV SOLN
INTRAVENOUS | Status: DC | PRN
  Administered 2021-01-10 (×3): 200 ug via INTRAVENOUS

## 2021-01-10 MED ORDER — BUPIVACAINE HCL (PF) 0.25 % IJ SOLN
INTRAMUSCULAR | Status: DC | PRN
  Administered 2021-01-10 (×2): 15 mL

## 2021-01-10 MED ORDER — CHLORHEXIDINE GLUCONATE 0.12 % MT SOLN: 15.0000 mL | Freq: Once | OROMUCOSAL | Status: AC

## 2021-01-10 MED ORDER — SODIUM CHLORIDE FLUSH 0.9 % IV SOLN
INTRAVENOUS | Status: AC
  Filled 2021-01-10: qty 10

## 2021-01-10 MED ORDER — ORAL CARE MOUTH RINSE: 15.0000 mL | Freq: Once | OROMUCOSAL | Status: AC

## 2021-01-10 MED ORDER — LIDOCAINE HCL (CARDIAC) PF 100 MG/5ML IV SOSY
PREFILLED_SYRINGE | INTRAVENOUS | Status: DC | PRN
  Administered 2021-01-10: 100 mg via INTRAVENOUS

## 2021-01-10 MED ORDER — ACETAMINOPHEN 10 MG/ML IV SOLN
INTRAVENOUS | Status: DC | PRN
  Administered 2021-01-10: 1000 mg via INTRAVENOUS

## 2021-01-10 MED ORDER — DEXAMETHASONE SODIUM PHOSPHATE 10 MG/ML IJ SOLN
INTRAMUSCULAR | Status: DC | PRN
  Administered 2021-01-10: 10 mg via INTRAVENOUS

## 2021-01-10 MED ORDER — HYDROMORPHONE HCL 1 MG/ML IJ SOLN
INTRAMUSCULAR | Status: DC | PRN
  Administered 2021-01-10: 1 mg via INTRAVENOUS

## 2021-01-10 MED ORDER — HYDROMORPHONE HCL 1 MG/ML IJ SOLN
INTRAMUSCULAR | Status: AC
  Filled 2021-01-10: qty 1

## 2021-01-10 MED ORDER — DEXMEDETOMIDINE (PRECEDEX) IN NS 20 MCG/5ML (4 MCG/ML) IV SYRINGE
PREFILLED_SYRINGE | INTRAVENOUS | Status: DC | PRN
  Administered 2021-01-10: 20 ug via INTRAVENOUS

## 2021-01-10 MED ORDER — LACTATED RINGERS IV SOLN: INTRAVENOUS | Status: DC

## 2021-01-10 MED ORDER — FENTANYL CITRATE (PF) 250 MCG/5ML IJ SOLN
INTRAMUSCULAR | Status: AC
  Filled 2021-01-10: qty 5

## 2021-01-10 MED ORDER — CEFAZOLIN SODIUM-DEXTROSE 2-4 GM/100ML-% IV SOLN
INTRAVENOUS | Status: AC
  Filled 2021-01-10: qty 100

## 2021-01-10 MED ORDER — BUPIVACAINE LIPOSOME 1.3 % IJ SUSP
INTRAMUSCULAR | Status: DC | PRN
Start: 2021-01-10 — End: 2021-01-10
  Administered 2021-01-10 (×2): 10 mL

## 2021-01-10 MED ORDER — DEXAMETHASONE SODIUM PHOSPHATE 10 MG/ML IJ SOLN
INTRAMUSCULAR | Status: AC
  Filled 2021-01-10: qty 1

## 2021-01-10 MED ORDER — PROPOFOL 10 MG/ML IV BOLUS
INTRAVENOUS | Status: DC | PRN
  Administered 2021-01-10: 200 mg via INTRAVENOUS

## 2021-01-10 MED ORDER — CHLORHEXIDINE GLUCONATE 0.12 % MT SOLN
OROMUCOSAL | Status: AC
  Administered 2021-01-10: 15 mL via OROMUCOSAL
  Filled 2021-01-10: qty 15

## 2021-01-10 MED ORDER — POVIDONE-IODINE 7.5 % EX SOLN
Freq: Once | CUTANEOUS | Status: DC
  Filled 2021-01-10: qty 118

## 2021-01-10 MED ORDER — FAMOTIDINE 20 MG PO TABS: 20.0000 mg | ORAL_TABLET | Freq: Once | ORAL | Status: AC

## 2021-01-10 MED ORDER — SUCCINYLCHOLINE CHLORIDE 200 MG/10ML IV SOSY
PREFILLED_SYRINGE | INTRAVENOUS | Status: AC
  Filled 2021-01-10: qty 10

## 2021-01-10 MED ORDER — EPHEDRINE SULFATE 50 MG/ML IJ SOLN
INTRAMUSCULAR | Status: DC | PRN
  Administered 2021-01-10: 25 mg via INTRAVENOUS

## 2021-01-10 MED ORDER — CEFAZOLIN SODIUM-DEXTROSE 2-4 GM/100ML-% IV SOLN
2.0000 g | INTRAVENOUS | Status: AC
  Administered 2021-01-10: 2 g via INTRAVENOUS

## 2021-01-10 MED ORDER — ONDANSETRON HCL 4 MG/2ML IJ SOLN
INTRAMUSCULAR | Status: DC | PRN
  Administered 2021-01-10: 4 mg via INTRAVENOUS

## 2021-01-10 MED ORDER — GLYCOPYRROLATE 0.2 MG/ML IJ SOLN
INTRAMUSCULAR | Status: DC | PRN
  Administered 2021-01-10: .2 mg via INTRAVENOUS

## 2021-01-10 SURGICAL SUPPLY — 70 items
BATTERY STRYKER  REUSABLE (MISCELLANEOUS) IMPLANT
BIT DRILL 2 FENESTRATED (MISCELLANEOUS) ×1 IMPLANT
BIT DRILL 2.4X140 LONG SOLID (BIT) ×2 IMPLANT
BIT DRILL CANNULTD 2.6 X 130MM (DRILL) ×1 IMPLANT
BIT DRILLL 2 FENESTRATED (MISCELLANEOUS) ×1
BLADE SURG 15 STRL LF DISP TIS (BLADE) ×2 IMPLANT
BLADE SURG 15 STRL SS (BLADE) ×4
BNDG CMPR STD VLCR NS LF 5.8X4 (GAUZE/BANDAGES/DRESSINGS) ×2
BNDG COHESIVE 4X5 TAN STRL (GAUZE/BANDAGES/DRESSINGS) ×2 IMPLANT
BNDG CONFORM 2 STRL LF (GAUZE/BANDAGES/DRESSINGS) ×2 IMPLANT
BNDG CONFORM 3 STRL LF (GAUZE/BANDAGES/DRESSINGS) ×2 IMPLANT
BNDG ELASTIC 4X5.8 VLCR NS LF (GAUZE/BANDAGES/DRESSINGS) ×4 IMPLANT
BNDG ESMARK 4X12 TAN STRL LF (GAUZE/BANDAGES/DRESSINGS) ×2 IMPLANT
BNDG GAUZE 4.5X4.1 6PLY STRL (MISCELLANEOUS) ×2 IMPLANT
CANISTER SUCT 1200ML W/VALVE (MISCELLANEOUS) ×4 IMPLANT
COUNTERSICK 4.0 HEADED (MISCELLANEOUS) ×2
COVER WAND RF STERILE (DRAPES) ×2 IMPLANT
CUFF TOURN SGL QUICK 12 (TOURNIQUET CUFF) IMPLANT
CUFF TOURN SGL QUICK 18X4 (TOURNIQUET CUFF) IMPLANT
DRAPE FLUOR MINI C-ARM 54X84 (DRAPES) ×2 IMPLANT
DRILL CANNULATED 2.6 X 130MM (DRILL) ×2
DURAPREP 26ML APPLICATOR (WOUND CARE) ×2 IMPLANT
ELECT REM PT RETURN 9FT ADLT (ELECTROSURGICAL) ×2
ELECTRODE REM PT RTRN 9FT ADLT (ELECTROSURGICAL) ×1 IMPLANT
GAUZE 4X4 16PLY RFD (DISPOSABLE) ×2 IMPLANT
GAUZE SPONGE 4X4 12PLY STRL (GAUZE/BANDAGES/DRESSINGS) ×2 IMPLANT
GAUZE XEROFORM 1X8 LF (GAUZE/BANDAGES/DRESSINGS) ×2 IMPLANT
GLOVE BIO SURGEON STRL SZ7.5 (GLOVE) ×2 IMPLANT
GLOVE INDICATOR 8.0 STRL GRN (GLOVE) ×2 IMPLANT
GOWN STRL REUS W/ TWL XL LVL3 (GOWN DISPOSABLE) ×2 IMPLANT
GOWN STRL REUS W/TWL XL LVL3 (GOWN DISPOSABLE) ×4
K-WIRE SMOOTH 1.6X150MM (WIRE) ×12
K-WIRE SNGL END 1.2X150 (MISCELLANEOUS) ×2
KWIRE SMOOTH 1.6X150MM (WIRE) ×6 IMPLANT
KWIRE SNGL END 1.2X150 (MISCELLANEOUS) ×1 IMPLANT
MANIFOLD NEPTUNE II (INSTRUMENTS) ×4 IMPLANT
NEEDLE FILTER BLUNT 18X 1/2SAF (NEEDLE) ×1
NEEDLE FILTER BLUNT 18X1 1/2 (NEEDLE) ×1 IMPLANT
NEEDLE HYPO 22GX1.5 SAFETY (NEEDLE) ×2 IMPLANT
NEEDLE HYPO 25X1 1.5 SAFETY (NEEDLE) ×4 IMPLANT
NS IRRIG 500ML POUR BTL (IV SOLUTION) ×4 IMPLANT
PACK EXTREMITY ARMC (MISCELLANEOUS) ×2 IMPLANT
PADDING CAST BLEND 4X4 NS (MISCELLANEOUS) ×2 IMPLANT
PENCIL ELECTRO HAND CTR (MISCELLANEOUS) ×2 IMPLANT
PENCIL SMOKE ULTRAEVAC 22 CON (MISCELLANEOUS) ×2 IMPLANT
PLATE  STR SMALL SLANTED LT 3H (Plate) ×1 IMPLANT
PLATE 30 DOGBONE (Plate) ×2 IMPLANT
PLATE STR SLANTED LT 4H (Plate) ×2 IMPLANT
PLATE STR SMALL SLANTED LT 3H (Plate) ×1 IMPLANT
PUTTY DBX 1CC (Putty) ×2 IMPLANT
PUTTY DBX 1CC DEPUY (Putty) ×1 IMPLANT
SCREW 3.5X16 NONLOCKING (Screw) ×4 IMPLANT
SCREW 3.5X22 (Screw) ×2 IMPLANT
SCREW COUNTERSINK 4.0 HEADED (MISCELLANEOUS) ×1 IMPLANT
SCREW LOCK 3 3.5X18 (Screw) ×2 IMPLANT
SCREW LOCK PLATE R3 3.5X12 (Screw) ×4 IMPLANT
SCREW LOCK PLATE R3 3.5X14 (Screw) ×6 IMPLANT
SCREW LOCK PLATE R3 3.5X16 (Screw) ×10 IMPLANT
SPLINT CAST 1 STEP 4X30 (MISCELLANEOUS) ×2 IMPLANT
SPLINT FAST PLASTER 5X30 (CAST SUPPLIES) ×1
SPLINT PLASTER CAST FAST 5X30 (CAST SUPPLIES) ×1 IMPLANT
STOCKINETTE M/LG 89821 (MISCELLANEOUS) ×2 IMPLANT
STRAP SAFETY 5IN WIDE (MISCELLANEOUS) ×2 IMPLANT
STRIP CLOSURE SKIN 1/4X4 (GAUZE/BANDAGES/DRESSINGS) ×2 IMPLANT
SUT VIC AB 4-0 FS2 27 (SUTURE) ×2 IMPLANT
SWABSTK COMLB BENZOIN TINCTURE (MISCELLANEOUS) ×2 IMPLANT
SYR 10ML LL (SYRINGE) ×2 IMPLANT
SYR 50ML LL SCALE MARK (SYRINGE) ×2 IMPLANT
WIRE OLIVE SMOOTH 1.4MMX60MM (WIRE) ×4 IMPLANT
WIRE Z .062 C-WIRE SPADE TIP (WIRE) ×2 IMPLANT

## 2021-01-10 NOTE — Transfer of Care (Signed)
Immediate Anesthesia Transfer of Care Note  Patient: Dennis Francis  Procedure(s) Performed: OPEN REDUCTION INTERNAL FIXATION (ORIF) FOOT LISFRANC FRACTURE X 5 JOINTS LEFT (Left Foot)  Patient Location: PACU and OR recovery covid  Anesthesia Type:General  Level of Consciousness: sedated  Airway & Oxygen Therapy: Patient Spontanous Breathing and Patient connected to face mask oxygen  Post-op Assessment: Report given to RN and Post -op Vital signs reviewed and stable  Post vital signs: Reviewed  Last Vitals:  Vitals Value Taken Time  BP 111/54 01/10/21 1514  Temp 36.2 C 01/10/21 1514  Pulse 72 01/10/21 1514  Resp 14 01/10/21 1514  SpO2 100 % 01/10/21 1514    Last Pain:  Vitals:   01/10/21 1058  PainSc: 3       Patients Stated Pain Goal: 1 (01/10/21 1058)  Complications: No complications documented.

## 2021-01-10 NOTE — Op Note (Signed)
Operative note   Surgeon:Letanya Froh Lawyer: None    Preop diagnosis: Lisfranc fracture dislocation left foot    Postop diagnosis: Same    Procedure: 1.  Open reduction with arthrodesis of the first second and third metatarsocuneiform joints 2.  Open reduction with internal fixation of fourth and fifth metatarsal cuboid joints    EBL: Minimal    Anesthesia:local and general    Hemostasis: Mid calf tourniquet inflated to 200 mmHg for 122 minutes    Specimen: None    Complications: None    Operative indications:Briana Boerema is an 50 y.o. that presents today for surgical intervention.  The risks/benefits/alternatives/complications have been discussed and consent has been given.    Procedure:  Patient was brought into the OR and placed on the operating table in thesupine position. After anesthesia was obtained theleft lower extremity was prepped and draped in usual sterile fashion.  Attention was initially directed to the dorsal aspect of the midfoot where an incision was made overlying the second and third met cuneiform joint region.  Sharp and blunt dissection was carried down to the periosteum.  Subperiosteal dissection was then undertaken.  Care was taken to retract all vital neurovascular structures throughout the procedure.  At this time the second and third metatarsals were completely dislocated dorsal and laterally displaced to its normal articulation there was noted to be fairly severe comminution specially to the plantar aspect of the second met cuneiform joint as well as the third metatarsal cuneiform joint.  Secondary incision was then performed along the dorsal medial aspect of the first metatarsocuneiform joint.  Sharp and blunt dissection was carried down through the periosteum.  Subperiosteal dissection was then undertaken.  At this time there was noted to be a compression fracture of the medial cuneiform with medial and dorsal displacement of the first metatarsal to  his normal articular alignment.  This was an intra-articular fracture within the joint itself.  Mild displacement was noted at this time.  At this time the decision was made to perform an arthrodesis of the first second and third metatarsocuneiform joints given the intra-articular fracture and comminution noted throughout.  All joints were prepared and cartilage removed.  This was taken down to the subchondral bone plate and bleeding bone.  All areas were then drilled with a 2.0 mm drill bit.  Initially the second metatarsocuneiform joint was reapproximated.  This was stabilized with a K wire.  A dorsal Paragon locking plate with a compression slot was used to arthrodesis this joint site.  Prior to final arthrodesis and compression the area was infiltrated with DBX bone putty.  Small tarsometatarsal locking plate with 3.5 millimeter screws were initially used.  At this time good realignment was noted.  Next the first metatarsocuneiform joint was stabilized and a medial plate 4-hole locking plate with a compression slot as well was used to realign the first metatarsocuneiform joint.  Once again the joint was infiltrated with DBX prior to final compression.  Finally a third locking plate was placed over the third metatarsocuneiform joint.  This was also infiltrated with DBX bone graft.  3.5 millimeter screws were used in all areas.  At this time good realignment of the foot was noted.  The fourth and fifth metatarsals were in a much better aligned position.  Just blunt dissection was taken along the lateral and central portion of the incision and the fourth and fifth met cuboid joints were noted to be realigned with finger palpation.  The  wounds were all flushed with copious amounts of irrigation.  Layered closure was then performed with 2-0 and 3-0 Vicryl for the deeper layers and a 3-0 nylon for the skin.  After final closure and the open reduction had been performed of the fourth and fifth met cuboid region K wires  were then driven across the fourth and fifth met cuboid joint for final stabilization.  All areas were infiltrated with a combination of 0.25% bupivacaine and Exparel long-acting anesthetic.  He was then placed in the posterior splint with good compression noted.    Patient tolerated the procedure and anesthesia well.  Was transported from the OR to the PACU with all vital signs stable and vascular status intact. To be discharged per routine protocol.  Will follow up in approximately 1 week in the outpatient clinic.  A prescription for Percocet was sent for postoperative pain management.

## 2021-01-10 NOTE — H&P (Signed)
HISTORY AND PHYSICAL INTERVAL NOTE:  01/10/2021  11:26 AM  Dennis Francis  has presented today for surgery, with the diagnosis of S93.325D LISFRANC DISLOCATION, LEFT.  The various methods of treatment have been discussed with the patient.  No guarantees were given.  After consideration of risks, benefits and other options for treatment, the patient has consented to surgery.  I have reviewed the patients' chart and labs.     A history and physical examination was performed in my office.  The patient was reexamined.  There have been no changes to this history and physical examination.  Dennis Francis A

## 2021-01-10 NOTE — OR Nursing (Signed)
Notified wife via cell phone and received voice mail. Left simple message of starting procedure.

## 2021-01-10 NOTE — Anesthesia Preprocedure Evaluation (Signed)
Anesthesia Evaluation  Patient identified by MRN, date of birth, ID band Patient awake    Reviewed: Allergy & Precautions, NPO status , Patient's Chart, lab work & pertinent test results  Airway Mallampati: II  TM Distance: >3 FB     Dental   Pulmonary neg pulmonary ROS,    Pulmonary exam normal        Cardiovascular hypertension, Normal cardiovascular exam     Neuro/Psych Anxiety negative neurological ROS     GI/Hepatic negative GI ROS, Neg liver ROS,   Endo/Other  negative endocrine ROS  Renal/GU stones  negative genitourinary   Musculoskeletal   Abdominal Normal abdominal exam  (+)   Peds negative pediatric ROS (+)  Hematology negative hematology ROS (+)   Anesthesia Other Findings Past Medical History: No date: Anxiety No date: History of kidney stones No date: Hypertension No date: Kidney stones  Reproductive/Obstetrics                             Anesthesia Physical Anesthesia Plan  ASA: II  Anesthesia Plan: General   Post-op Pain Management:    Induction: Intravenous  PONV Risk Score and Plan:   Airway Management Planned: Oral ETT  Additional Equipment:   Intra-op Plan:   Post-operative Plan: Extubation in OR  Informed Consent: I have reviewed the patients History and Physical, chart, labs and discussed the procedure including the risks, benefits and alternatives for the proposed anesthesia with the patient or authorized representative who has indicated his/her understanding and acceptance.     Dental advisory given  Plan Discussed with: CRNA and Surgeon  Anesthesia Plan Comments:         Anesthesia Quick Evaluation

## 2021-01-10 NOTE — Anesthesia Postprocedure Evaluation (Signed)
Anesthesia Post Note  Patient: Dennis Francis  Procedure(s) Performed: OPEN REDUCTION INTERNAL FIXATION (ORIF) FOOT LISFRANC FRACTURE X 5 JOINTS LEFT (Left Foot)  Patient location during evaluation: PACU Anesthesia Type: General Level of consciousness: awake and alert Pain management: pain level controlled Vital Signs Assessment: post-procedure vital signs reviewed and stable Respiratory status: spontaneous breathing, nonlabored ventilation, respiratory function stable and patient connected to nasal cannula oxygen Cardiovascular status: blood pressure returned to baseline and stable Postop Assessment: no apparent nausea or vomiting Anesthetic complications: no   No complications documented.   Last Vitals:  Vitals:   01/10/21 1514  BP: (!) 111/54  Pulse: 72  Resp: 14  Temp: (!) 36.2 C  SpO2: 100%    Last Pain:  Vitals:   01/10/21 1058  PainSc: 3                  Lenard Simmer

## 2021-01-10 NOTE — Progress Notes (Signed)
Pt recovered in OR. D/C instructions given to wife over the phone, verbalized understanding.

## 2021-01-10 NOTE — Discharge Instructions (Signed)
AMBULATORY SURGERY  DISCHARGE INSTRUCTIONS   1) The drugs that you were given will stay in your system until tomorrow so for the next 24 hours you should not:  A) Drive an automobile B) Make any legal decisions C) Drink any alcoholic beverage   2) You may resume regular meals tomorrow.  Today it is better to start with liquids and gradually work up to solid foods.  You may eat anything you prefer, but it is better to start with liquids, then soup and crackers, and gradually work up to solid foods.   3) Please notify your doctor immediately if you have any unusual bleeding, trouble breathing, redness and pain at the surgery site, drainage, fever, or pain not relieved by medication.    4) Additional Instructions:        Please contact your physician with any problems or Same Day Surgery at [phone], Monday through Friday 6 am to 4 pm, or [place] at [facility] number at [phone].[facility]  POST OPERATIVE INSTRUCTIONS FOR DR. TROXLER, DR. Ether Griffins, AND [facility]   1. Take your medication as prescribed.  Pain medication should be taken only as needed.  2. Keep the dressing clean, dry and intact.  3. Keep your foot elevated above the heart level for the first 48 hours.  4. Walking to the bathroom and brief periods of walking are acceptable, unless we have instructed you to be non-weight bearing.  5. Always wear your post-op shoe when walking.  Always use your crutches if you are to be non-weight bearing.  6. Do not take a shower. Baths are permissible as long as the foot is kept out of the water.   7. Every hour you are awake:  - Bend your knee 15 times.  8. Call [facility] [phone]) if any of the following problems occur: - You develop a temperature or fever. - The bandage becomes saturated with blood. - Medication does not stop your pain. - Injury of the foot  occurs. - Any symptoms of infection including redness, odor, or red streaks running from wound.

## 2021-01-10 NOTE — Anesthesia Procedure Notes (Signed)
Procedure Name: Intubation Performed by: Darliss Cheney, CRNA Pre-anesthesia Checklist: Patient identified, Emergency Drugs available, Suction available and Patient being monitored Patient Re-evaluated:Patient Re-evaluated prior to induction Oxygen Delivery Method: Circle system utilized Preoxygenation: Pre-oxygenation with 100% oxygen Induction Type: IV induction and Cricoid Pressure applied Laryngoscope Size: McGraph and 4 Grade View: Grade III Tube type: Oral Tube size: 7.5 mm Number of attempts: 2 Airway Equipment and Method: Stylet Placement Confirmation: ETT inserted through vocal cords under direct vision,  positive ETCO2 and breath sounds checked- equal and bilateral Secured at: 22 cm Tube secured with: Tape Dental Injury: Teeth and Oropharynx as per pre-operative assessment  Difficulty Due To: Difficult Airway- due to anterior larynx

## 2021-01-13 ENCOUNTER — Encounter: Payer: Self-pay | Admitting: Podiatry

## 2021-01-15 DIAGNOSIS — M79605 Pain in left leg: Secondary | ICD-10-CM | POA: Diagnosis not present

## 2021-01-15 DIAGNOSIS — F17220 Nicotine dependence, chewing tobacco, uncomplicated: Secondary | ICD-10-CM | POA: Diagnosis not present

## 2021-01-15 DIAGNOSIS — I10 Essential (primary) hypertension: Secondary | ICD-10-CM | POA: Diagnosis not present

## 2021-01-15 DIAGNOSIS — G8918 Other acute postprocedural pain: Secondary | ICD-10-CM | POA: Insufficient documentation

## 2021-01-15 DIAGNOSIS — Z79899 Other long term (current) drug therapy: Secondary | ICD-10-CM | POA: Insufficient documentation

## 2021-01-16 ENCOUNTER — Emergency Department: Payer: BC Managed Care – PPO

## 2021-01-16 ENCOUNTER — Other Ambulatory Visit: Payer: Self-pay

## 2021-01-16 ENCOUNTER — Emergency Department
Admission: EM | Admit: 2021-01-16 | Discharge: 2021-01-16 | Disposition: A | Discharge status: Home / Self Care | Payer: BC Managed Care – PPO | Attending: Emergency Medicine | Admitting: Emergency Medicine

## 2021-01-16 DIAGNOSIS — G8918 Other acute postprocedural pain: Secondary | ICD-10-CM

## 2021-01-16 MED ORDER — OXYCODONE-ACETAMINOPHEN 5-325 MG PO TABS
2.0000 | ORAL_TABLET | Freq: Once | ORAL | Status: AC
Start: 2021-01-16 — End: 2021-01-16
  Administered 2021-01-16: 2 via ORAL
  Filled 2021-01-16: qty 2

## 2021-01-16 NOTE — ED Provider Notes (Signed)
[facility] Emergency Department Provider Note  ____________________________________________   Event Date/Time   First MD Initiated Contact with Patient 01/16/21 0133     (approximate)  I have reviewed the triage vital signs and the nursing notes.   HISTORY  Chief Complaint Post-op Problem    HPI Dennis Francis is a 50 y.o. male with history of hypertension who presents to the emergency department with complaints of left leg pain.  The patient underwent ORIF with Dr. Ether Griffins for a Lisfranc injury to the left foot on 01/10/2021.  States he started having calf pain yesterday that radiates down the leg.  He ran out of his pain medication yesterday as well.  States his doctor is trying to "wean me off".  Has an appointment to see Dr. Ether Griffins at 11 AM today.  No new injury.  No fever.  No chest pain or shortness of breath.  States he was sent here by his primary care doctor to rule out DVT.        Past Medical History:  Diagnosis Date  . Anxiety   . History of kidney stones   . Hypertension   . Kidney stones     Patient Active Problem List   Diagnosis Date Noted  . Unspecified fracture of left foot, initial encounter for closed fracture 12/29/2020  . Lisfranc dislocation, left, initial encounter 12/28/2020    Past Surgical History:  Procedure Laterality Date  . EYE SURGERY Bilateral    laser surgery   . LIPOMA EXCISION  1992  . OPEN REDUCTION INTERNAL FIXATION (ORIF) FOOT LISFRANC FRACTURE Left 01/10/2021   Procedure: OPEN REDUCTION INTERNAL FIXATION (ORIF) FOOT LISFRANC FRACTURE X 5 JOINTS LEFT;  Surgeon: Gwyneth Revels, DPM;  Location: [facility] ORS;  Service: Podiatry;  Laterality: Left;    Prior to Admission medications   Medication Sig Start Date End Date Taking? Authorizing Provider  acetaminophen (TYLENOL) 500 MG tablet Take 1,000 mg by mouth every 6 (six) hours as needed.    [provider]  atorvastatin (LIPITOR) 40 MG tablet Take 40 mg  by mouth daily.    [provider]  citalopram (CELEXA) 20 MG tablet Take 20 mg by mouth daily.    [provider]  hydrochlorothiazide (HYDRODIURIL) 12.5 MG tablet Take 12.5 mg by mouth daily.    [provider]  ibuprofen (ADVIL) 600 MG tablet Take 600 mg by mouth every 6 (six) hours as needed.    [provider]  losartan (COZAAR) 50 MG tablet Take 50 mg by mouth 2 (two) times daily.    [provider]  niacin 500 MG tablet Take 500 mg by mouth daily.    [provider]  oxyCODONE-acetaminophen (PERCOCET) 5-325 MG tablet Take 1-2 tablets by mouth every 6 (six) hours as needed for severe pain. Max 6 tabs per day 12/29/20   Gwyneth Revels, DPM  oxyCODONE-acetaminophen (PERCOCET) 5-325 MG tablet Take 1-2 tablets by mouth every 6 (six) hours as needed for severe pain. Max 6 tabs per day 01/10/21   Gwyneth Revels, DPM    Allergies Patient has no allergy information on record.  Family History  Problem Relation Age of Onset  . Hypertension Mother   . Diabetes Father   . Hypertension Father   . Seizures Brother     Social History Social History   Tobacco Use  . Smoking status: Never Smoker  . Smokeless tobacco: Current User    Types: Chew  Vaping Use  . Vaping Use:  Never used  Substance Use Topics  . Alcohol use: Yes    Comment: rarely  . Drug use: No    Review of Systems Constitutional: No fever. Eyes: No visual changes. ENT: No sore throat. Cardiovascular: Denies chest pain. Respiratory: Denies shortness of breath. Gastrointestinal: No nausea, vomiting, diarrhea. Genitourinary: Negative for dysuria. Musculoskeletal: Negative for back pain. Skin: Negative for rash. Neurological: Negative for focal weakness or numbness.  ____________________________________________   PHYSICAL EXAM:  VITAL SIGNS: ED Triage Vitals [01/16/21 0001]  Enc Vitals Group     BP (!) 165/88     Pulse Rate 67     Resp 20     Temp 98.2 F  (36.8 C)     Temp Source Oral     SpO2 97 %     Weight 217 lb (98.4 kg)     Height 5\' 8"  (1.727 m)     Head Circumference      Peak Flow      Pain Score 5     Pain Loc      Pain Edu?      Excl. in GC?    CONSTITUTIONAL: Alert and oriented and responds appropriately to questions. Well-appearing; well-nourished HEAD: Normocephalic EYES: Conjunctivae clear, pupils appear equal, EOM appear intact ENT: normal nose; moist mucous membranes NECK: Supple, normal ROM CARD: RRR; S1 and S2 appreciated; no murmurs, no clicks, no rubs, no gallops RESP: Normal chest excursion without splinting or tachypnea; breath sounds clear and equal bilaterally; no wheezes, no rhonchi, no rales, no hypoxia or respiratory distress, speaking full sentences ABD/GI: Normal bowel sounds; non-distended; soft, non-tender, no rebound, no guarding, no peritoneal signs, no hepatosplenomegaly BACK: The back appears normal EXT: Normal ROM in all joints; no deformity noted, no edema; no cyanosis; cast in place to the left lower extremity SKIN: Normal color for age and race; warm; no rash on exposed skin NEURO: Moves all extremities equally PSYCH: The patient's mood and manner are appropriate.  ____________________________________________   LABS (all labs ordered are listed, but only abnormal results are displayed)  Labs Reviewed - No data to display ____________________________________________  EKG  none ____________________________________________  RADIOLOGY I, Eureka Valdes, personally viewed and evaluated these images (plain radiographs) as part of my medical decision making, as well as reviewing the written report by the radiologist.  ED MD interpretation: No acute abnormality seen on x-ray.  No DVT seen on ultrasound.  Official radiology report(s): US Venous Img Lower Unilateral Left  Result Date: 01/16/2021 CLINICAL DATA:  Left lower extremity pain EXAM: LEFT LOWER EXTREMITY VENOUS DOPPLER ULTRASOUND  TECHNIQUE: Gray-scale sonography with compression, as well as color and duplex ultrasound, were performed to evaluate the deep venous system(s) from the level of the common femoral vein through the popliteal and proximal calf veins. COMPARISON:  None. FINDINGS: VENOUS Normal compressibility of the common femoral, superficial femoral, and popliteal veins, as well as the visualized calf veins. Visualized portions of profunda femoral vein and great saphenous vein unremarkable. No filling defects to suggest DVT on grayscale or color Doppler imaging. Doppler waveforms show normal direction of venous flow, normal respiratory plasticity and response to augmentation. Limited views of the contralateral common femoral vein are unremarkable. OTHER None. Limitations: Evaluation of the lower calf was limited by an overlying cast. IMPRESSION: No evidence for left lower extremity DVT given the limitations described above. Electronically Signed   By: Katherine Mantle M.D.   On: 01/16/2021 01:22   [place] Extrem Low Left Ltd  Result  Date: 01/16/2021 CLINICAL DATA:  Calf pain. Recent evaluation for DVT. Recent cast removal. Evaluation of calf veins requested. EXAM: ULTRASOUND LEFT LOWER EXTREMITY LIMITED TECHNIQUE: Ultrasound examination of the lower extremity soft tissues was performed in the area of clinical concern. COMPARISON:  Left lower extremity color flow duplex Doppler 01/16/2021. FINDINGS: No mass lesions or fluid collections are noted. Calf veins appear patent. No evidence of venous thrombosis. IMPRESSION: No evidence of venous thrombosis within the calf veins. Electronically Signed   By: Maisie Fus  Register   On: 01/16/2021 05:29   DG Foot Complete Left  Result Date: 01/16/2021 CLINICAL DATA:  Increasing left leg pain. Surgical repair of fractures last week. EXAM: LEFT FOOT - COMPLETE 3+ VIEW COMPARISON:  12/28/2020 FINDINGS: Interval postoperative changes with reduction of Lisfranc fracture dislocation of the left  foot. Plate and screw fixations of the first through third tarsometatarsal joints and K-wire fixation of the fourth and fifth tarsometatarsal joints. Residual displaced cuboidal fracture fragments are suggested. Overlying cast material is present which obscures some bone detail. Mild soft tissue swelling. IMPRESSION: Interval reduction and surgical fixation of previous Lisfranc fracture dislocation of the left foot. Electronically Signed   By: Burman Nieves M.D.   On: 01/16/2021 02:42    ____________________________________________   PROCEDURES  Procedure(s) performed (including Critical Care):  Procedures    ____________________________________________   INITIAL IMPRESSION / ASSESSMENT AND PLAN / ED COURSE  As part of my medical decision making, I reviewed the following data within the electronic MEDICAL RECORD NUMBER History obtained from family, Nursing notes reviewed and incorporated, Radiograph reviewed, Notes from prior ED visits and [place] Controlled Substance Database         Patient here with left lower extremity pain.  Ultrasound obtained from triage shows no DVT but limited due to cast still being in place.  X-ray shows no acute abnormality.  Given I am unable to perform full exam or obtain a full ultrasound to rule out DVT, I feel his splint will need to be removed and ultrasound needs to be repeated.  Patient has been given oral pain medication here with good relief.   Repeat ultrasound shows no calf DVT.  Splint removed.  He has a 2+ left DP pulse.  Incision sites are clean, dry and intact with minimal surrounding redness without warmth and no bleeding or drainage.  He is able to move his toes normally has normal capillary refill and sensation.  Compartments in the left leg are soft.  He has 2 pins noted to the left dorsal lateral foot that are intact.  Discussed with nursing staff that we need to replace padding to the dorsal foot and have recommended copious padding and  replacing posterior splint.  He knows that he needs to keep his appointment with his podiatrist today at 36.  His pain is well controlled at this time.  He will talk to his podiatrist today for further pain management.   At this time, I do not feel there is any life-threatening condition present. I have reviewed, interpreted and discussed all results (EKG, imaging, lab, urine as appropriate) and exam findings with patient/family. I have reviewed nursing notes and appropriate previous records.  I feel the patient is safe to be discharged home without further emergent workup and can continue workup as an outpatient as needed. Discussed usual and customary return precautions. Patient/family verbalize understanding and are comfortable with this plan.  Outpatient follow-up has been provided as needed. All questions have been answered.  ____________________________________________  FINAL CLINICAL IMPRESSION(S) / ED DIAGNOSES  Final diagnoses:  Post-operative pain     ED Discharge Orders    None      *Please note:  Dennis Francis was evaluated in Emergency Department on 01/16/2021 for the symptoms described in the history of present illness. He was evaluated in the context of the global COVID-19 pandemic, which necessitated consideration that the patient might be at risk for infection with the SARS-CoV-2 virus that causes COVID-19. Institutional protocols and algorithms that pertain to the evaluation of patients at risk for COVID-19 are in a state of rapid change based on information released by regulatory bodies including the CDC and federal and state organizations. These policies and algorithms were followed during the patient's care in the ED.  Some ED evaluations and interventions may be delayed as a result of limited staffing during and the pandemic.*   Note:  This document was prepared using Dragon voice recognition software and may include unintentional dictation errors.   Saurabh Hettich, Layla Maw,  DO 01/16/21 [phone]

## 2021-01-16 NOTE — ED Notes (Signed)
Old cast removed from leg at this time per MD request.

## 2021-01-16 NOTE — ED Notes (Signed)
Pt alert and oriented X 4, stable for discharge. RR even and unlabored, color WNL. Discussed discharge instructions and follow-up as directed. Discharge medications discussed if prescribed. Pt had opportunity to ask questions, and RN to provide patient/family eduction.  

## 2021-01-16 NOTE — ED Triage Notes (Signed)
Pt here for posterior left lower leg pain. Pt broke left foot last week and had surgical repair Friday, cast in place to LLE at this time. Sent here by pmd to rule out DVT.

## 2021-01-16 NOTE — Discharge Instructions (Addendum)
Please follow-up with your podiatrist as scheduled today at 11 AM.  Your ultrasound showed no blood clot.  Your x-ray showed no acute abnormality.  I recommend keeping your leg elevated when at rest which can help with pain and discomfort.

## 2021-01-27 ENCOUNTER — Emergency Department: Payer: BC Managed Care – PPO

## 2021-01-27 ENCOUNTER — Encounter: Payer: Self-pay | Admitting: Emergency Medicine

## 2021-01-27 ENCOUNTER — Other Ambulatory Visit: Payer: Self-pay

## 2021-01-27 ENCOUNTER — Emergency Department
Admission: EM | Admit: 2021-01-27 | Discharge: 2021-01-27 | Disposition: A | Discharge status: Home / Self Care | Payer: BC Managed Care – PPO | Attending: Student in an Organized Health Care Education/Training Program | Admitting: Student in an Organized Health Care Education/Training Program

## 2021-01-27 DIAGNOSIS — I10 Essential (primary) hypertension: Secondary | ICD-10-CM | POA: Insufficient documentation

## 2021-01-27 DIAGNOSIS — Z79899 Other long term (current) drug therapy: Secondary | ICD-10-CM | POA: Insufficient documentation

## 2021-01-27 DIAGNOSIS — M79672 Pain in left foot: Secondary | ICD-10-CM | POA: Diagnosis present

## 2021-01-27 NOTE — ED Triage Notes (Signed)
Pt comes into the ED via EMS from home with c/o pain to left foot pain after tripping today with hx of recent surgery, concerned he may have reinjured it

## 2021-01-27 NOTE — ED Provider Notes (Signed)
[facility] Emergency Department Provider Note    Event Date/Time   First MD Initiated Contact with Patient 01/27/21 Ernestina Columbia     (approximate)  I have reviewed the triage vital signs and the nursing notes.   HISTORY  Chief Complaint Fall and Foot Injury    HPI Maven Varelas is a 50 y.o. male status post recent left foot operative fixation presents to the ER after he was with his wife and walking through the door and wall try to get through the door and cross the threshold lost his balance and tripped putting weight on his left foot.  Felt sudden pain but denies any other injury.  Did not fall or hurt anything else given his recent surgery wanted to be evaluated make sure that the hardware was not damaged.  Past Medical History:  Diagnosis Date  . Anxiety   . History of kidney stones   . Hypertension   . Kidney stones    Family History  Problem Relation Age of Onset  . Hypertension Mother   . Diabetes Father   . Hypertension Father   . Seizures Brother    Past Surgical History:  Procedure Laterality Date  . EYE SURGERY Bilateral    laser surgery   . LIPOMA EXCISION  1992  . OPEN REDUCTION INTERNAL FIXATION (ORIF) FOOT LISFRANC FRACTURE Left 01/10/2021   Procedure: OPEN REDUCTION INTERNAL FIXATION (ORIF) FOOT LISFRANC FRACTURE X 5 JOINTS LEFT;  Surgeon: Gwyneth Revels, DPM;  Location: [facility] ORS;  Service: Podiatry;  Laterality: Left;   Patient Active Problem List   Diagnosis Date Noted  . Unspecified fracture of left foot, initial encounter for closed fracture 12/29/2020  . Lisfranc dislocation, left, initial encounter 12/28/2020      Prior to Admission medications   Medication Sig Start Date End Date Taking? Authorizing Provider  acetaminophen (TYLENOL) 500 MG tablet Take 1,000 mg by mouth every 6 (six) hours as needed.    [provider]  atorvastatin (LIPITOR) 40 MG tablet Take 40 mg by mouth daily.    [provider]   citalopram (CELEXA) 20 MG tablet Take 20 mg by mouth daily.    [provider]  hydrochlorothiazide (HYDRODIURIL) 12.5 MG tablet Take 12.5 mg by mouth daily.    [provider]  ibuprofen (ADVIL) 600 MG tablet Take 600 mg by mouth every 6 (six) hours as needed.    [provider]  losartan (COZAAR) 50 MG tablet Take 50 mg by mouth 2 (two) times daily.    [provider]  niacin 500 MG tablet Take 500 mg by mouth daily.    [provider]  oxyCODONE-acetaminophen (PERCOCET) 5-325 MG tablet Take 1-2 tablets by mouth every 6 (six) hours as needed for severe pain. Max 6 tabs per day 12/29/20   Gwyneth Revels, DPM  oxyCODONE-acetaminophen (PERCOCET) 5-325 MG tablet Take 1-2 tablets by mouth every 6 (six) hours as needed for severe pain. Max 6 tabs per day 01/10/21   Gwyneth Revels, DPM    Allergies Patient has no known allergies.    Social History Social History   Tobacco Use  . Smoking status: Never Smoker  . Smokeless tobacco: Current User    Types: Chew  Vaping Use  . Vaping Use: Never used  Substance Use Topics  . Alcohol use: Yes    Comment: rarely  . Drug use: No    Review of Systems Patient denies headaches, rhinorrhea, blurry vision, numbness, shortness of breath, chest  pain, edema, cough, abdominal pain, nausea, vomiting, diarrhea, dysuria, fevers, rashes or hallucinations unless otherwise stated above in HPI. ____________________________________________   PHYSICAL EXAM:  VITAL SIGNS: Vitals:   01/27/21 1816  BP: 124/89  Pulse: 60  Resp: 20  Temp: 98.4 F (36.9 C)  SpO2: 98%    Constitutional: Alert and oriented. Well appearing and in no acute distress. Eyes: Conjunctivae are normal.  Head: Atraumatic. Nose: No congestion/rhinnorhea. Mouth/Throat: Mucous membranes are moist.   Neck: Painless ROM.  Cardiovascular:   Good peripheral circulation. Respiratory: Normal respiratory effort.  No retractions.   Gastrointestinal: Soft and nontender.  Musculoskeletal: Right lower extremity normal, left lower extremity in splint with appropriate postop changes.  Sensation intact cap refill brisk.  Compartments soft.  No joint effusions. Neurologic:  Normal speech and language. No gross focal neurologic deficits are appreciated.  Skin:  Skin is warm, dry and intact. No rash noted. Psychiatric: Mood and affect are normal. Speech and behavior are normal.  ____________________________________________   LABS (all labs ordered are listed, but only abnormal results are displayed)  No results found for this or any previous visit (from the past 24 hour(s)). ____________________________________________ ____________________________________________  RADIOLOGY  I personally reviewed all radiographic images ordered to evaluate for the above acute complaints and reviewed radiology reports and findings.  These findings were personally discussed with the patient.  Please see medical record for radiology report.  ____________________________________________   PROCEDURES  Procedure(s) performed:  Procedures    Critical Care performed: no ____________________________________________   INITIAL IMPRESSION / ASSESSMENT AND PLAN / ED COURSE  Pertinent labs & imaging results that were available during my care of the patient were reviewed by me and considered in my medical decision making (see chart for details).  DDX: Fracture, dislocation, contusion  Jovin Fester is a 50 y.o. who presents to the ED with presentation as described above.  Exam reassuring.  X-ray without any evidence of hardware displacement.  No fracture.  Patient stable and appropriate for outpatient follow-up.      ____________________________________________   FINAL CLINICAL IMPRESSION(S) / ED DIAGNOSES  Final diagnoses:  Acute foot pain, left      NEW MEDICATIONS STARTED DURING THIS VISIT:  New Prescriptions   No  medications on file     Note:  This document was prepared using Dragon voice recognition software and may include unintentional dictation errors.     Willy Eddy, MD 01/27/21 [phone]

## 2021-01-27 NOTE — Discharge Instructions (Signed)
Keep leg elevated.  Follow up with ortho.

## 2021-01-27 NOTE — ED Triage Notes (Signed)
Pt via EMS from home. Pt tripped and fell around 04:30 this evening. Pt c/o L foot pain. Pt recently had surgery on 01/27 on the L foot. Pt is A&Ox4 and NAD. Splint noted to the L foot.

## 2022-09-18 IMAGING — US US EXTREM LOW*L* LIMITED
1 series · 11 of 11 positions shown · non-contrast
Comparison: Left lower extremity color flow duplex Doppler
01/16/2021.

CLINICAL DATA: Calf pain. Recent evaluation for DVT. Recent cast
removal. Evaluation of calf veins requested.

EXAM:
ULTRASOUND LEFT LOWER EXTREMITY LIMITED
TECHNIQUE: Ultrasound examination of the lower extremity soft tissues was
performed in the area of clinical concern.

[Series 1: us limited joint space structures low left · 11 of 11 slices shown]
[im 1/11]
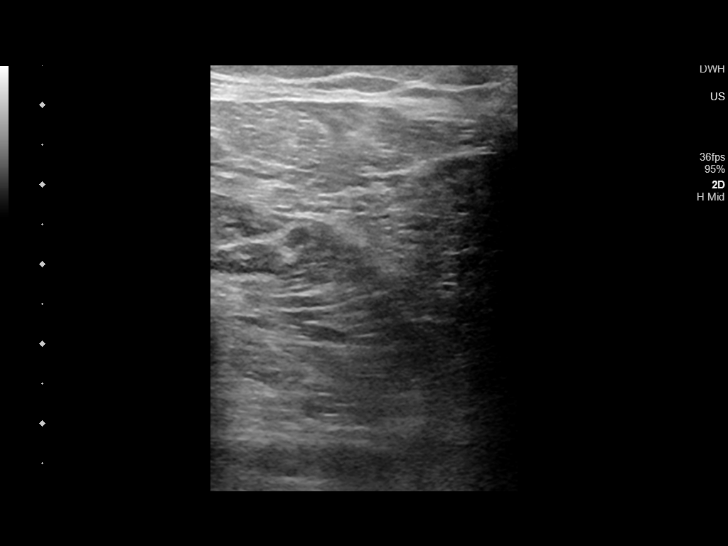
[im 2/11]
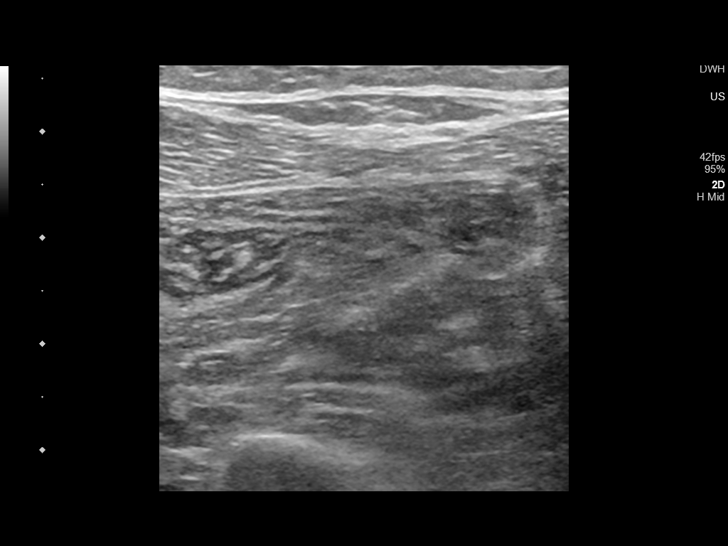
[im 3/11]
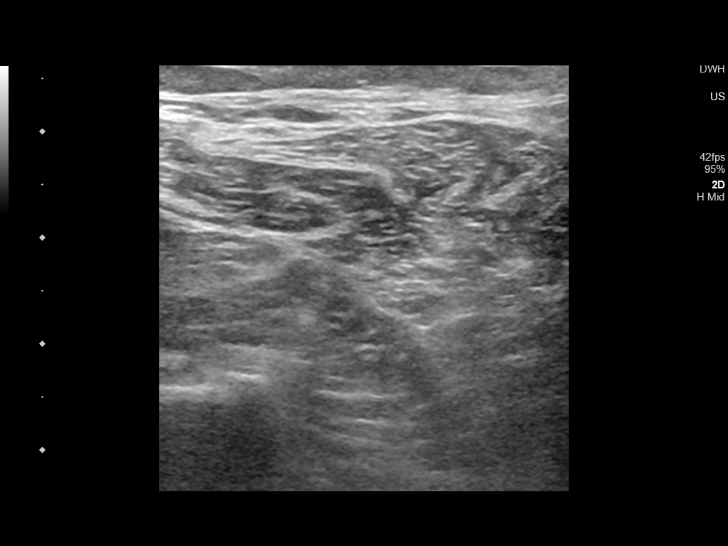
[im 4/11]
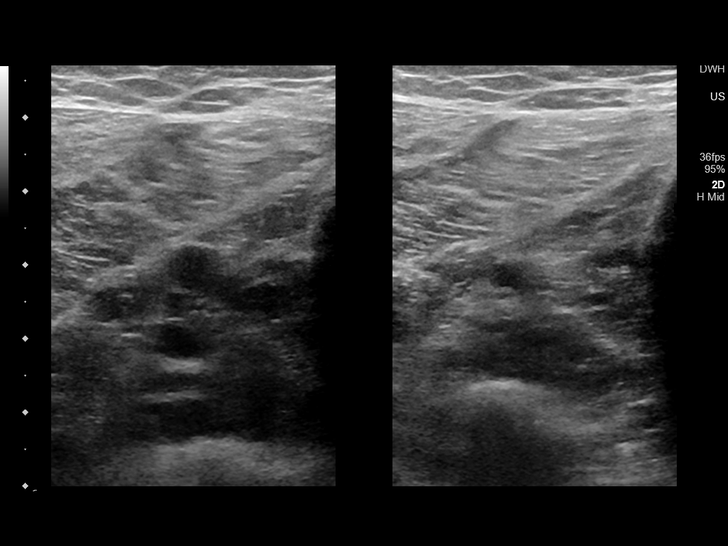
[im 5/11]
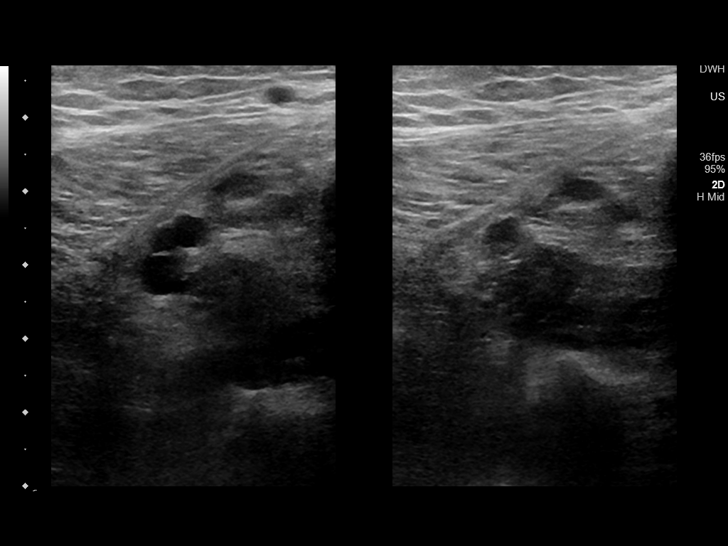
[im 6/11]
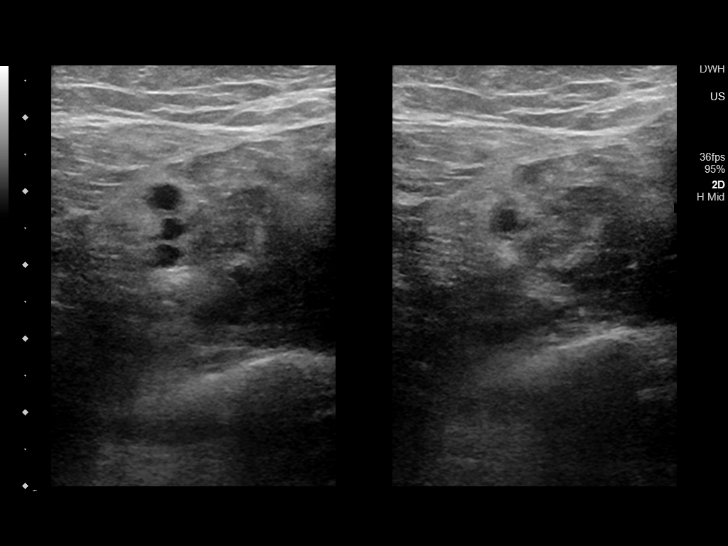
[im 7/11]
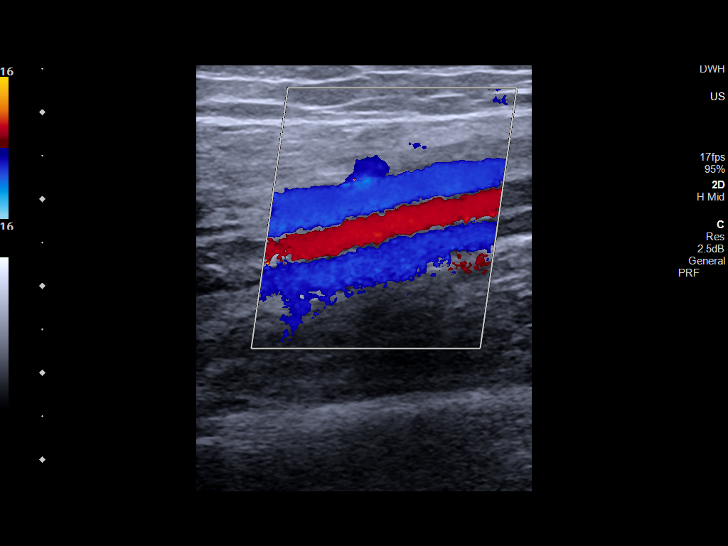
[im 8/11]
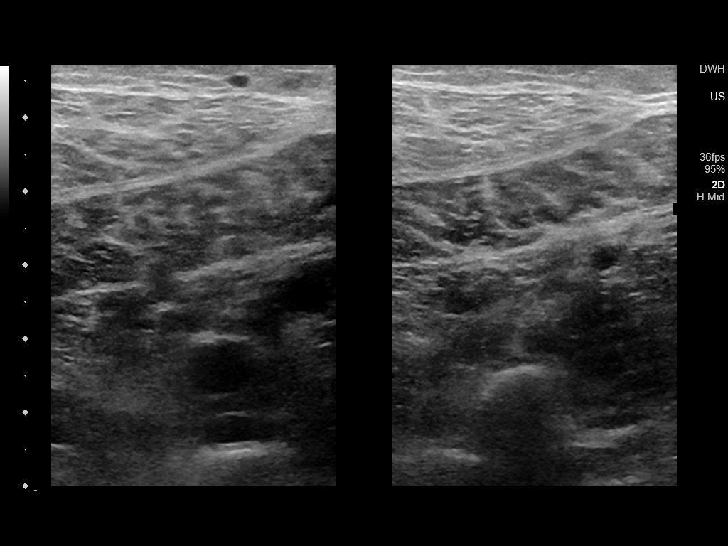
[im 9/11]
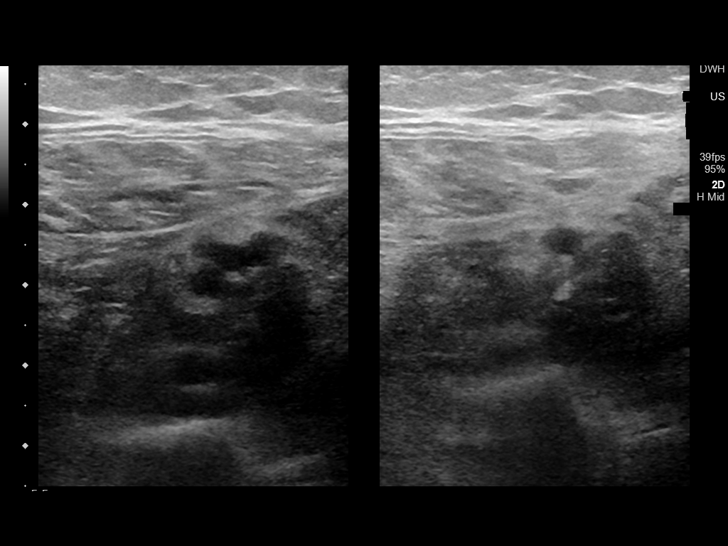
[im 10/11]
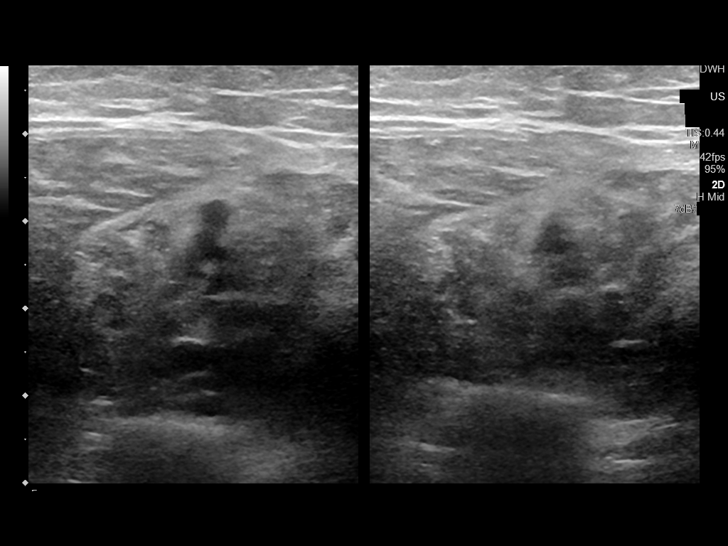
[im 11/11]
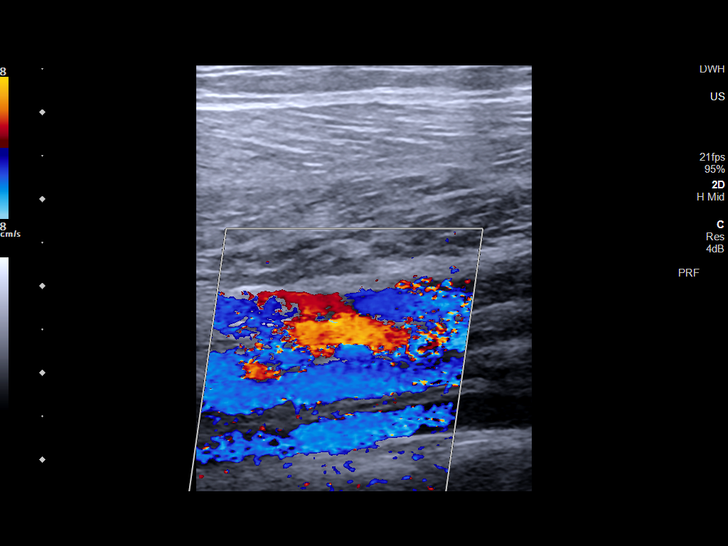

[11 of 11 positions shown; findings below may reference images not displayed]

FINDINGS: No mass lesions or fluid collections are noted. Calf veins appear
patent. No evidence of venous thrombosis.
IMPRESSION: No evidence of venous thrombosis within the calf veins.

## 2024-04-17 ENCOUNTER — Ambulatory Visit
Admission: EM | Admit: 2024-04-17 | Discharge: 2024-04-17 | Disposition: A | Discharge status: Home / Self Care | Attending: Family Medicine | Admitting: Family Medicine

## 2024-04-17 DIAGNOSIS — M79672 Pain in left foot: Secondary | ICD-10-CM

## 2024-04-17 DIAGNOSIS — L089 Local infection of the skin and subcutaneous tissue, unspecified: Secondary | ICD-10-CM | POA: Diagnosis not present

## 2024-04-17 NOTE — ED Triage Notes (Signed)
 Patient had surgery on his foot 3 yrs ago. drainage that started from a blister on top of foot.

## 2024-04-17 NOTE — ED Notes (Signed)
 Patient is being discharged from the Urgent Care and sent to the Emergency Department via personal vehicle . Per Dr. Percilla Boys, patient is in need of higher level of care due to foot infection. Patient is aware and verbalizes understanding of plan of care.  Vitals:   04/17/24 1853  BP: (!) 160/95  Pulse: 71  Resp: 14  Temp: 98.6 F (37 C)  SpO2: 98%

## 2024-04-17 NOTE — Discharge Instructions (Signed)
 You have had surgery with remaining hardware with drainage coming out for about a month. I suspect you need a CT scan to determine if you have infection in your bone.    You have been advised to follow up immediately in the emergency department for concerning signs or symptoms as discussed during your visit. If you declined EMS transport, please have a family member take you directly to the ED at this time. Do not delay.   Based on concerns about condition, if you do not follow up in the ED, you may risk poor outcomes including worsening of condition, delayed treatment and potentially life threatening issues. If you have declined to go to the ED at this time, you should call your PCP immediately to set up a follow up appointment.   Go to ED for red flag symptoms, including; fevers you cannot reduce with Tylenol /Motrin, severe headaches, vision changes, numbness/weakness in part of the body, lethargy, confusion, intractable vomiting, severe dehydration, chest pain, breathing difficulty, severe persistent abdominal or pelvic pain, signs of severe infection (increased redness, swelling of an area), feeling faint or passing out, dizziness, etc. You should especially go to the ED for sudden acute worsening of condition if you do not elect to go at this time.

## 2024-04-17 NOTE — ED Provider Notes (Signed)
 [facility]    CSN: [id number] Arrival date & time: 04/17/24  1835      History   Chief Complaint Chief Complaint  Patient presents with   Foot Problem    HPI  HPI Dennis Francis is a 53 y.o. male.   Dennis Francis presents for left foot pain and drainage intermittently for the past month.  He feels like his foot really never healed after his foot surgery about 3 years ago.  He states he has lots of metal in his foot.  Has not taken any recent antibiotics.  Denies any fever, chills, body aches, nausea or difficulty walking.  He has some pain with walking.  He did not reach out to his old Careers adviser prior to arrival.      Past Medical History:  Diagnosis Date   Anxiety    History of kidney stones    Hypertension    Kidney stones     Patient Active Problem List   Diagnosis Date Noted   Unspecified fracture of left foot, initial encounter for closed fracture 12/29/2020   Lisfranc dislocation, left, initial encounter 12/28/2020    Past Surgical History:  Procedure Laterality Date   EYE SURGERY Bilateral    laser surgery    LIPOMA EXCISION  1992   OPEN REDUCTION INTERNAL FIXATION (ORIF) FOOT LISFRANC FRACTURE Left 01/10/2021   Procedure: OPEN REDUCTION INTERNAL FIXATION (ORIF) FOOT LISFRANC FRACTURE X 5 JOINTS LEFT;  Surgeon: Anell Baptist, DPM;  Location: [facility] ORS;  Service: Podiatry;  Laterality: Left;       Home Medications    Prior to Admission medications   Medication Sig Start Date End Date Taking? Authorizing Provider  amLODipine (NORVASC) 10 MG tablet Take 1 tablet by mouth daily. 03/14/24 09/10/24 Yes [provider]  atorvastatin  (LIPITOR) 40 MG tablet Take 40 mg by mouth daily.   Yes [provider]  citalopram  (CELEXA ) 20 MG tablet Take 20 mg by mouth daily.   Yes [provider]  hydrochlorothiazide  (HYDRODIURIL ) 12.5 MG tablet Take 12.5 mg by mouth daily.   Yes [provider]  losartan  (COZAAR ) 50 MG tablet Take  50 mg by mouth 2 (two) times daily.   Yes [provider]  acetaminophen  (TYLENOL ) 500 MG tablet Take 1,000 mg by mouth every 6 (six) hours as needed.    [provider]  ibuprofen (ADVIL) 600 MG tablet Take 600 mg by mouth every 6 (six) hours as needed.    [provider]  niacin  500 MG tablet Take 500 mg by mouth daily.    [provider]  oxyCODONE -acetaminophen  (PERCOCET) 5-325 MG tablet Take 1-2 tablets by mouth every 6 (six) hours as needed for severe pain. Max 6 tabs per day 12/29/20   Anell Baptist, DPM  oxyCODONE -acetaminophen  (PERCOCET) 5-325 MG tablet Take 1-2 tablets by mouth every 6 (six) hours as needed for severe pain. Max 6 tabs per day 01/10/21   Anell Baptist, DPM    Family History Family History  Problem Relation Age of Onset   Hypertension Mother    Diabetes Father    Hypertension Father    Seizures Brother     Social History Social History   Tobacco Use   Smoking status: Never   Smokeless tobacco: Current    Types: Chew  Vaping Use   Vaping status: Never Used  Substance Use Topics   Alcohol use: Yes    Comment: rarely   Drug use: No     Allergies  Patient has no known allergies.   Review of Systems Review of Systems: :negative unless otherwise stated in HPI.      Physical Exam Triage Vital Signs ED Triage Vitals  Encounter Vitals Group     BP 04/17/24 1853 (!) 160/95     Systolic BP Percentile --      Diastolic BP Percentile --      Pulse Rate 04/17/24 1853 71     Resp 04/17/24 1853 14     Temp 04/17/24 1853 98.6 F (37 C)     Temp Source 04/17/24 1853 Oral     SpO2 04/17/24 1853 98 %     Weight --      Height --      Head Circumference --      Peak Flow --      Pain Score 04/17/24 1852 0     Pain Loc --      Pain Education --      Exclude from Growth Chart --    No data found.  Updated Vital Signs BP (!) 160/95 (BP Location: Right Arm)   Pulse 71   Temp 98.6 F (37 C) (Oral)   Resp 14    SpO2 98%   Visual Acuity Right Eye Distance:   Left Eye Distance:   Bilateral Distance:    Right Eye Near:   Left Eye Near:    Bilateral Near:     Physical Exam GEN: well appearing male in no acute distress  CVS: well perfused  RESP: speaking in full sentences without pause, no respiratory distress  MSK:   Ankle/Foot, left: TTP noted across the top of the midfoot extending to the distal 2nd through 4th metatarsals. Mild  erythema and moderate swelling.  Lateral to midline scar there is a area of drainage and erythema.  No ecchymosis, or bony deformity.  Notable pes planus deformity. Strength is 5/5 in all directions. No tenderness at the insertion/body/myotendinous junction of the Achilles tendon; No tenderness on posterior aspects of lateral and medial malleolus; Tenderness over the navicular prominence or  over cuboid; No pain at base of 5th MT; Able to walk 4 steps.        UC Treatments / Results  Labs (all labs ordered are listed, but only abnormal results are displayed) Labs Reviewed - No data to display  EKG   Radiology No results found.   Procedures Procedures (including critical care time)  Medications Ordered in UC Medications - No data to display  Initial Impression / Assessment and Plan / UC Course  I have reviewed the triage vital signs and the nursing notes.  Pertinent labs & imaging results that were available during my care of the patient were reviewed by me and considered in my medical decision making (see chart for details).      Pt is a 53 y.o.  male with history of Lisfranc dislocation with hardware about 3 years ago presents for left foot pain with discharge that started over a month ago.  On exam, pt has tenderness and drainage concerning for possible osteomyelitis.  Discussed with patient that he may need a CT that is not available here to rule out osteomyelitis.  Additionally, he may need IV antibiotics and a orthopedic surgery evaluation.   Discussed starting antibiotics now versus having him follow-up outpatient and he prefers to go to the ED now.  Wife agrees with this plan.  Wife will drive him to [facility] ED for further evaluation.  Discussed MDM, treatment plan and plan for follow-up with patient and his wife who agrees with plan.   Final Clinical Impressions(s) / UC Diagnoses   Final diagnoses:  Foot pain, left  Left foot infection     Discharge Instructions      You have had surgery with remaining hardware with drainage coming out for about a month. I suspect you need a CT scan to determine if you have infection in your bone.    You have been advised to follow up immediately in the emergency department for concerning signs or symptoms as discussed during your visit. If you declined EMS transport, please have a family member take you directly to the ED at this time. Do not delay.   Based on concerns about condition, if you do not follow up in the ED, you may risk poor outcomes including worsening of condition, delayed treatment and potentially life threatening issues. If you have declined to go to the ED at this time, you should call your PCP immediately to set up a follow up appointment.   Go to ED for red flag symptoms, including; fevers you cannot reduce with Tylenol /Motrin, severe headaches, vision changes, numbness/weakness in part of the body, lethargy, confusion, intractable vomiting, severe dehydration, chest pain, breathing difficulty, severe persistent abdominal or pelvic pain, signs of severe infection (increased redness, swelling of an area), feeling faint or passing out, dizziness, etc. You should especially go to the ED for sudden acute worsening of condition if you do not elect to go at this time.       ED Prescriptions   None    PDMP not reviewed this encounter.   Fidel Huddle, DO 04/19/24 1547
